# Patient Record
Sex: Female | Born: 1980 | Race: Black or African American | Hispanic: No | Marital: Single | Smoking: Current every day smoker
Health system: Southern US, Community
[De-identification: ages and names within clinical notes are randomized; demographics above are authoritative.]

## PROBLEM LIST (undated history)

## (undated) ENCOUNTER — Inpatient Hospital Stay: Payer: Self-pay

## (undated) DIAGNOSIS — R87629 Unspecified abnormal cytological findings in specimens from vagina: Secondary | ICD-10-CM

## (undated) DIAGNOSIS — I10 Essential (primary) hypertension: Secondary | ICD-10-CM

## (undated) DIAGNOSIS — Z789 Other specified health status: Secondary | ICD-10-CM

## (undated) HISTORY — PX: NO PAST SURGERIES: SHX2092

## (undated) HISTORY — DX: Unspecified abnormal cytological findings in specimens from vagina: R87.629

---

## 2005-11-04 ENCOUNTER — Emergency Department: Payer: Self-pay | Admitting: Emergency Medicine

## 2006-06-10 ENCOUNTER — Ambulatory Visit: Payer: Self-pay | Admitting: Family Medicine

## 2007-05-07 IMAGING — CR DG KNEE COMPLETE 4+V*R*
1 series · 4 of 4 positions shown · non-contrast
Comparison: none

REASON FOR EXAM: no known injury right knee swelling
COMMENTS:  LMP: N/A

PROCEDURE:     DXR - DXR KNEE RT COMP WITH OBLIQUES  - November 04, 2005  [DATE]
RESULT:     A suprapatellar effusion is appreciated, which appears to be
small to moderate.  The visualized bony skeleton demonstrates no evidence of
fracture or dislocation.

[Series 1: view not recorded · 0.17mm/px · 4 of 4 slices shown]
[im 1/4]
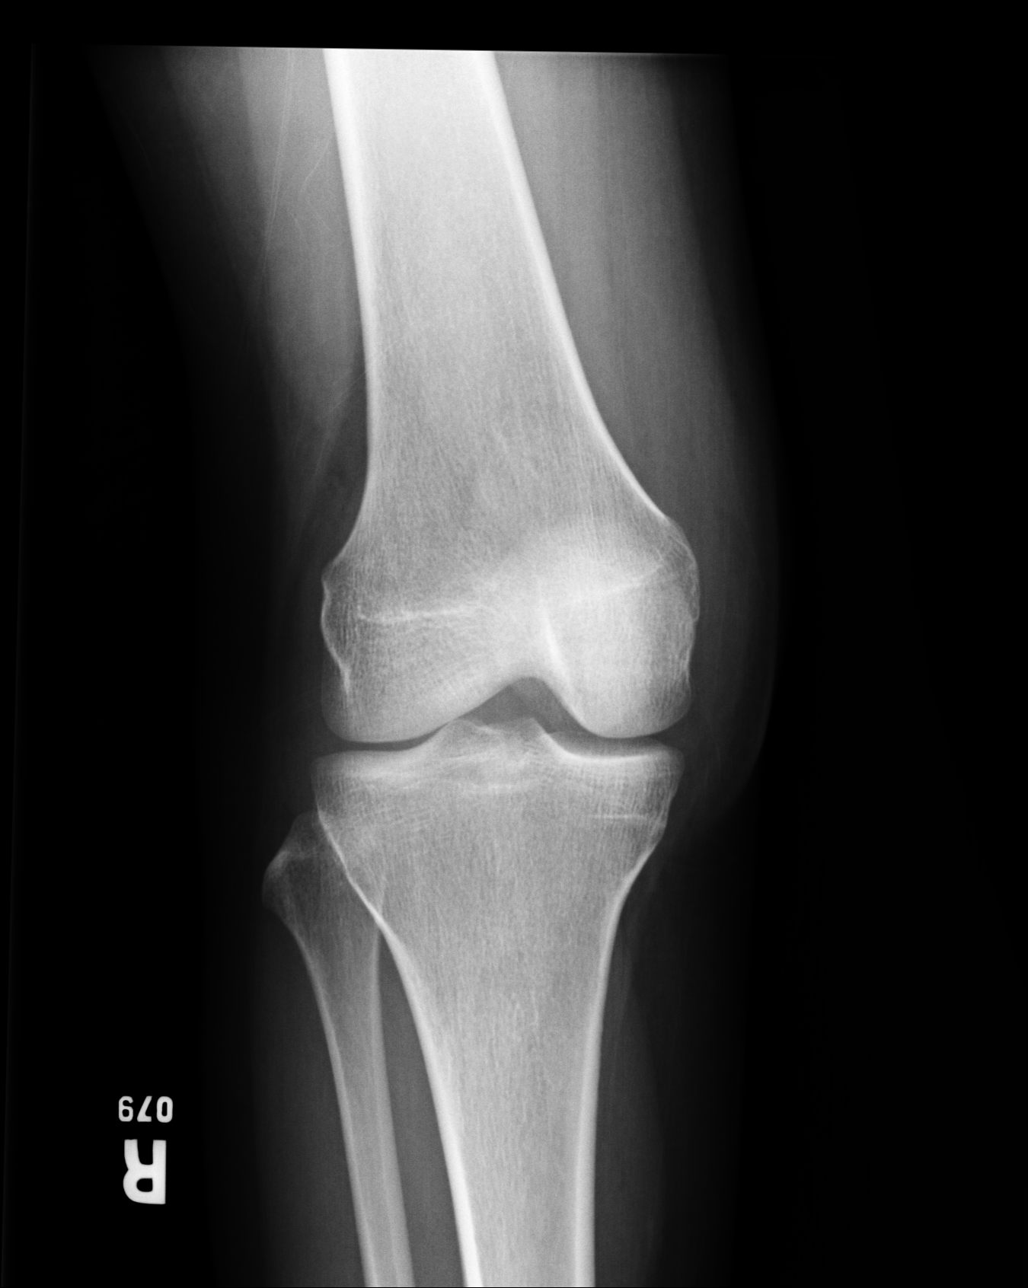
[im 2/4]
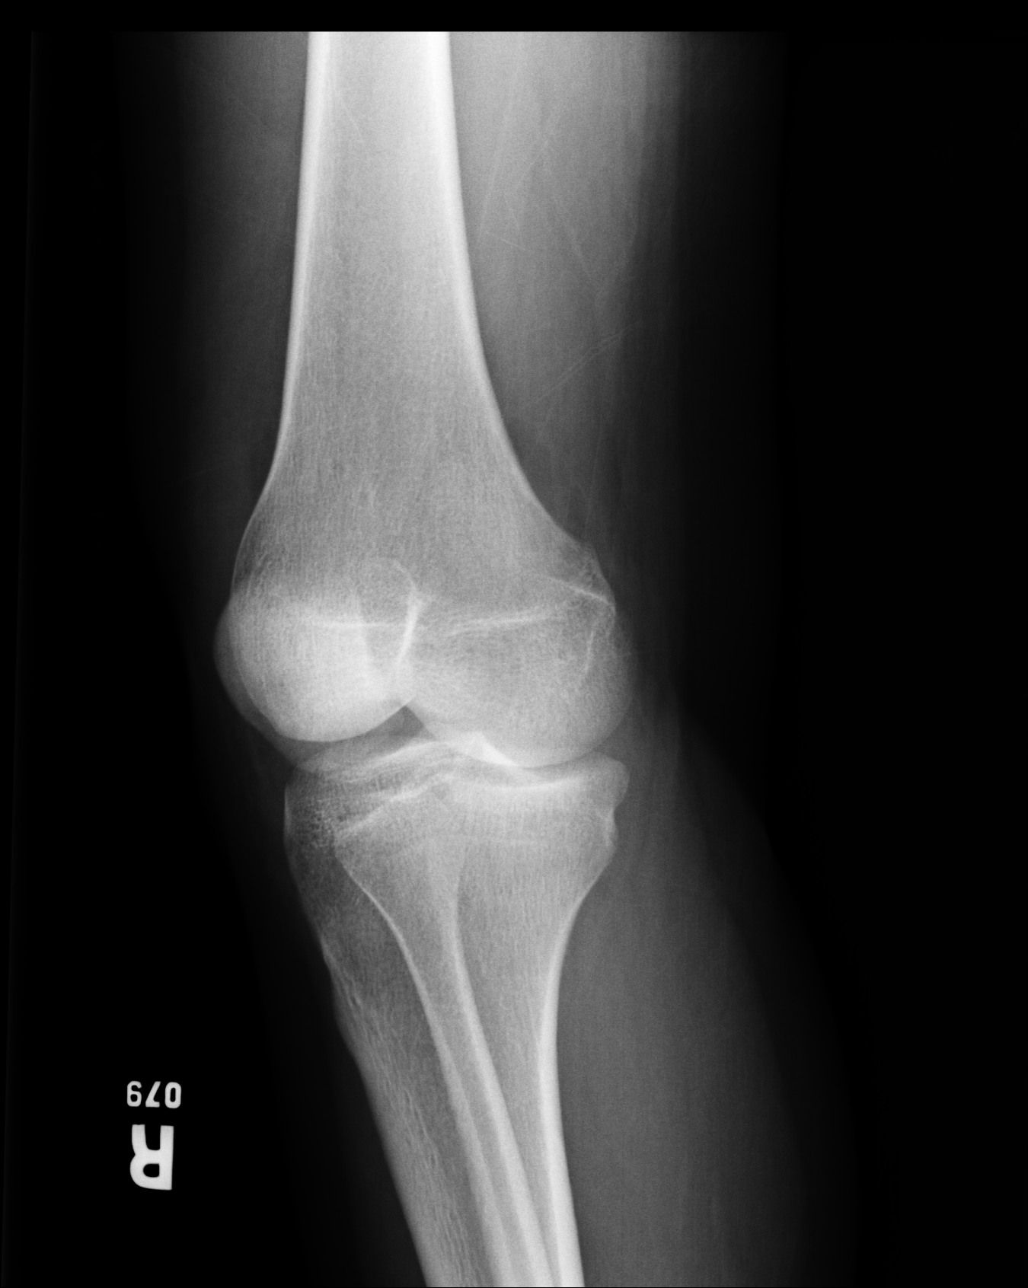
[im 3/4]
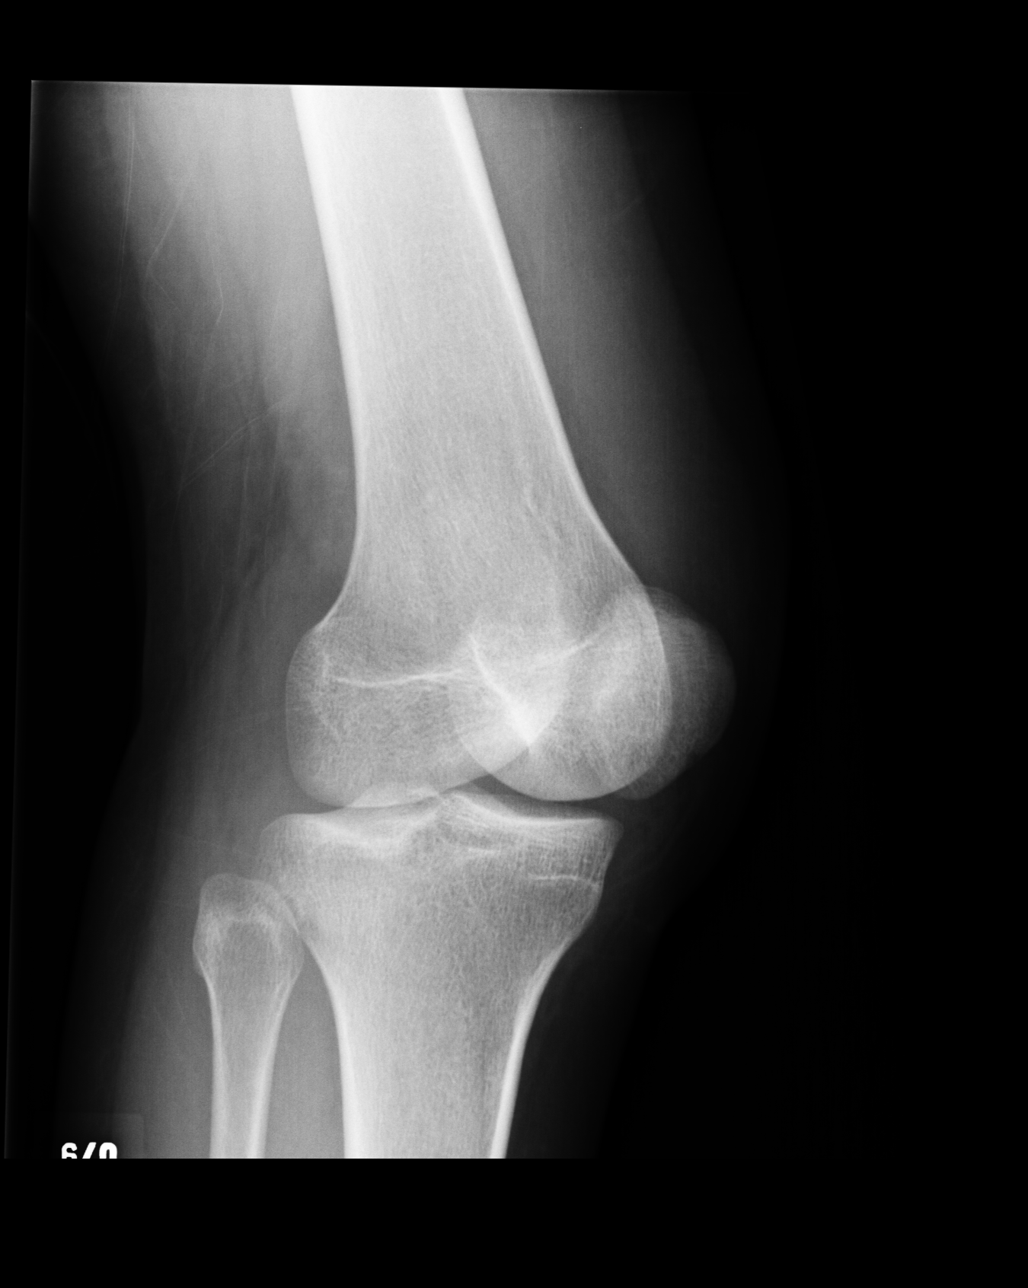
[im 4/4]
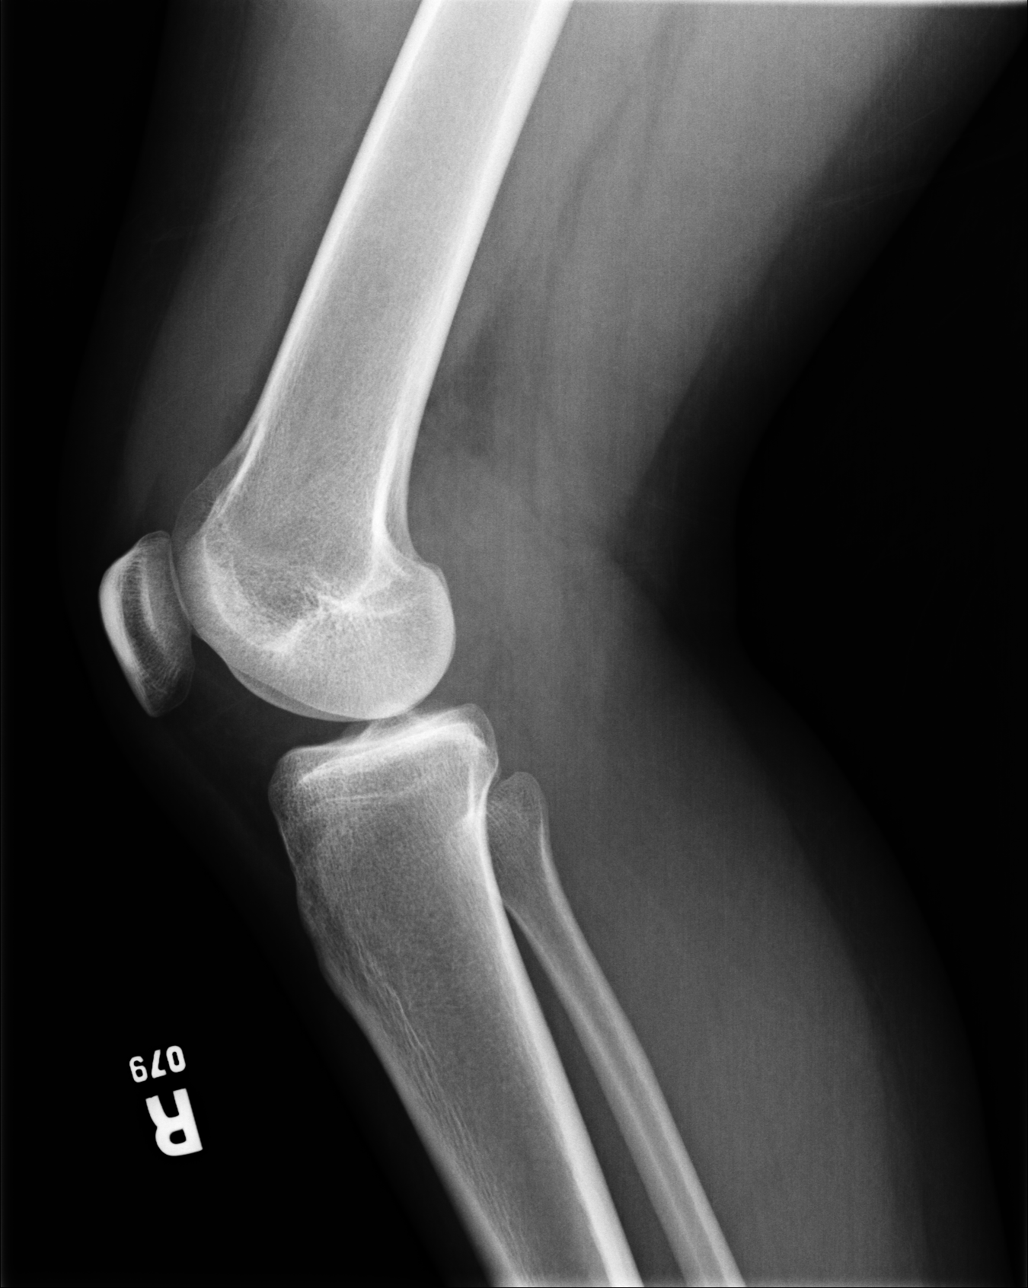

[4 of 4 positions shown; findings below may reference images not displayed]

IMPRESSION: 1)Suprapatellar effusion without evidence of fracture or dislocation.  If
there are persistent complaints of pain or persistent clinical concern
repeat evaluation in 7-10 days is recommended if clinically warranted.

## 2007-12-11 IMAGING — CT CT ABD-PELV W/ CM
1 of 2 series · 16 of 32 positions shown, 20 images · non-contrast
Comparison: none

REASON FOR EXAM: Severe abdominal pain, peritoneal signs
         Call report to: [phone]
COMMENTS:

PROCEDURE:     CT  - CT ABDOMEN / PELVIS  W  - June 10, 2006  [DATE]
RESULT:     The patient is experiencing severe abdominal pain, nausea and
vomiting.
TECHNIQUE: Axial images were obtained from the hemidiaphragms to the pubic
symphysis post intravenous and oral administration of contrast material.

[Series 2: appendicitis · axial · 0.61mm/px · z∈[-351,+3]mm · 16 of 130 slices shown, 20 images]
[im 6/130  soft-tissue]
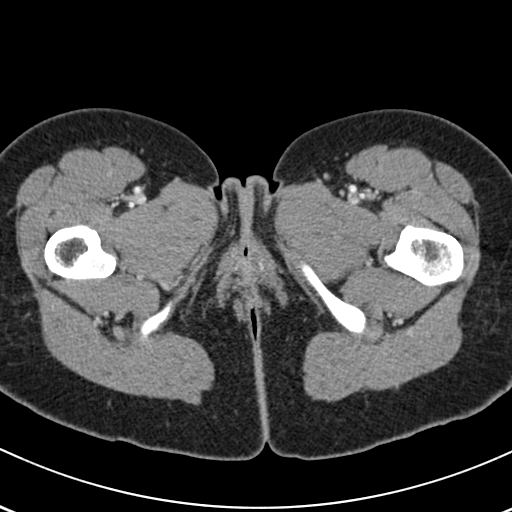
[im 6/130  bone]
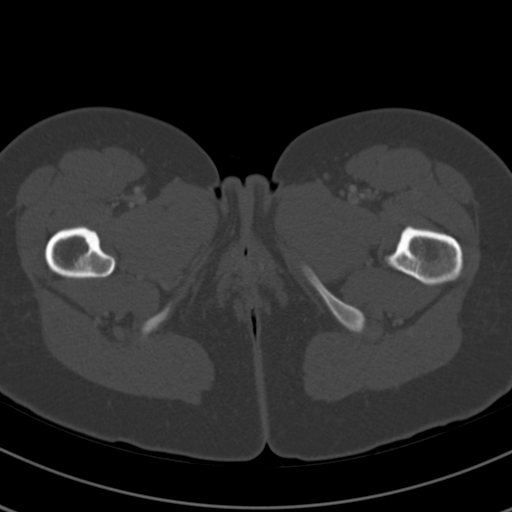
[im 16/130  soft-tissue]
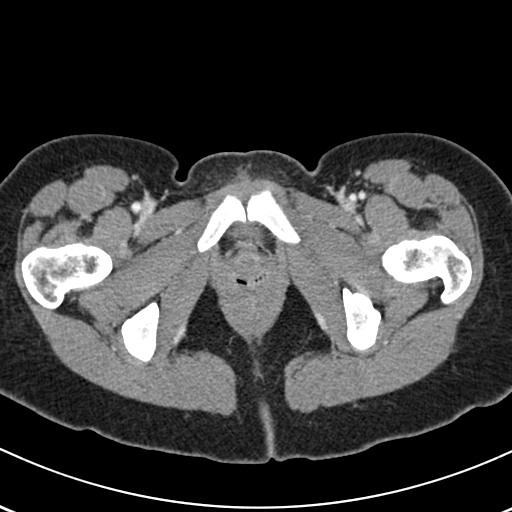
[im 26/130  soft-tissue]
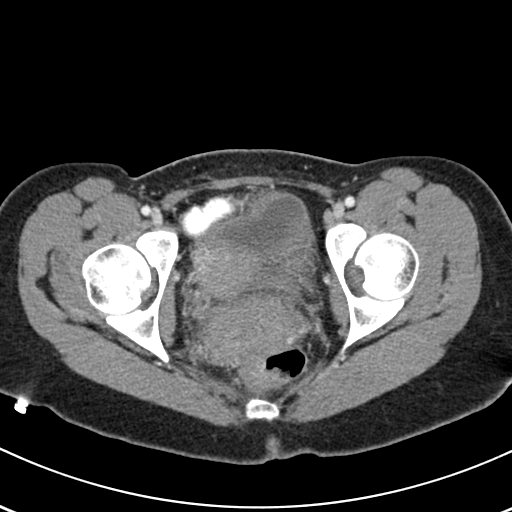
[im 37/130  soft-tissue]
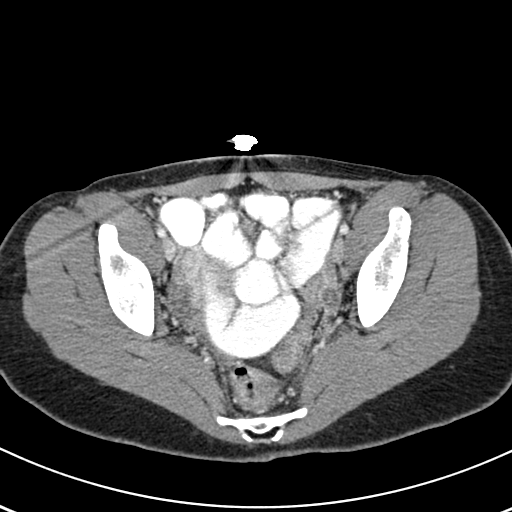
[im 42/130  soft-tissue]
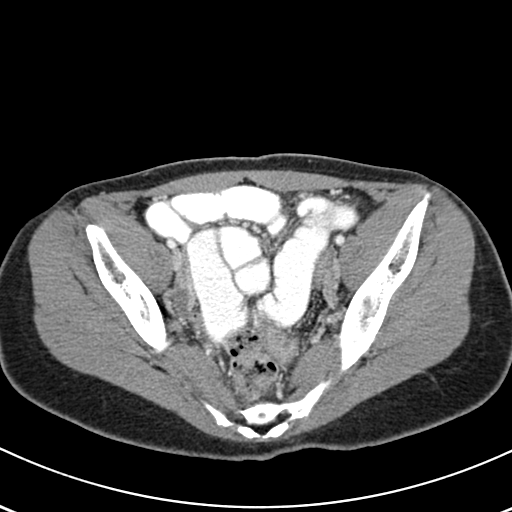
[im 52/130  soft-tissue]
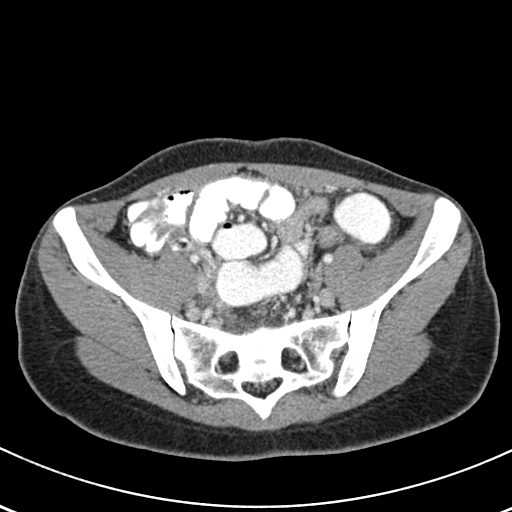
[im 62/130  soft-tissue]
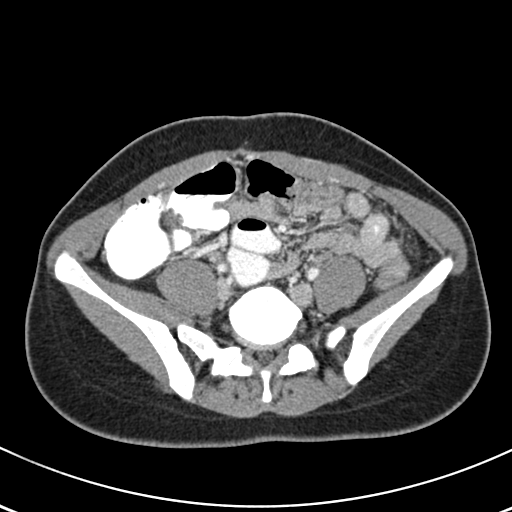
[im 68/130  soft-tissue]
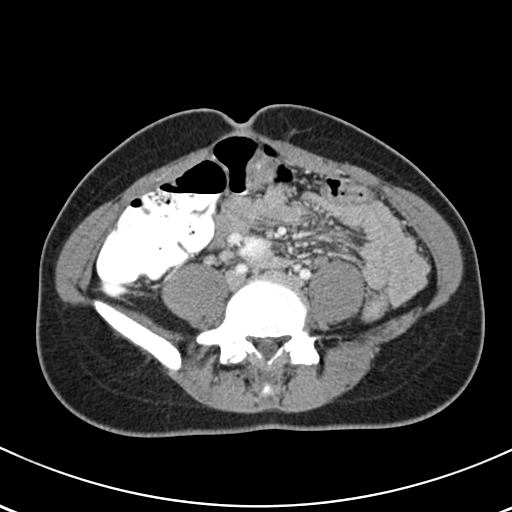
[im 78/130  soft-tissue]
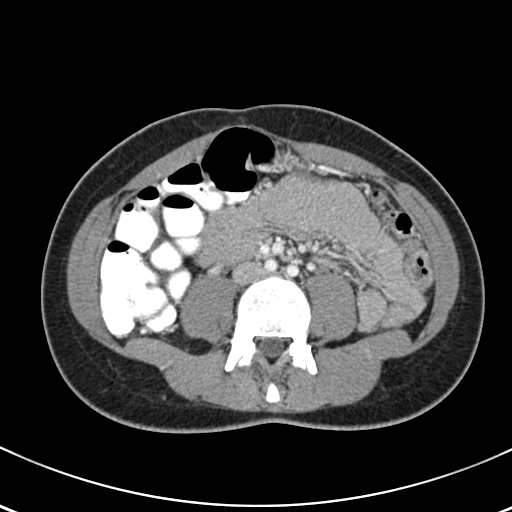
[im 78/130  bone]
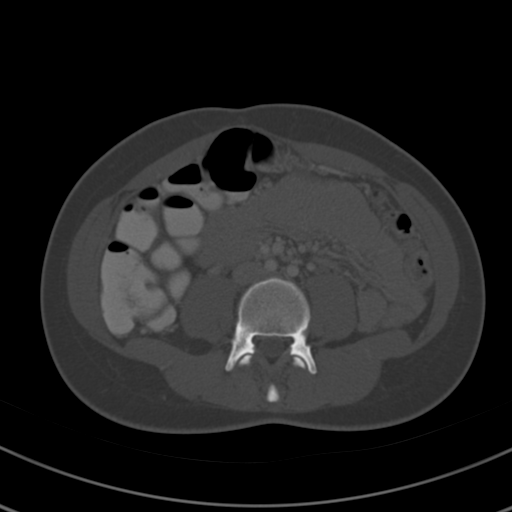
[im 88/130  soft-tissue]
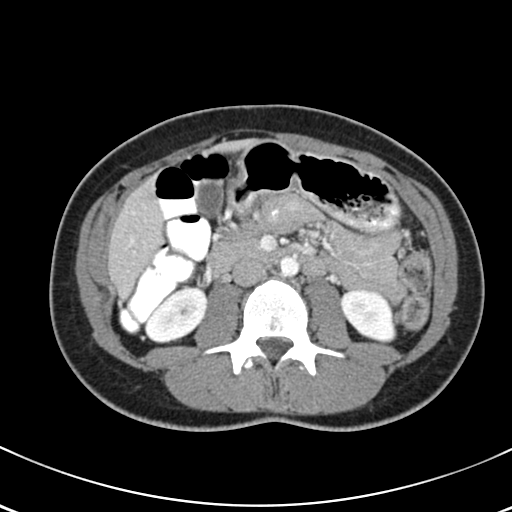
[im 99/130  soft-tissue]
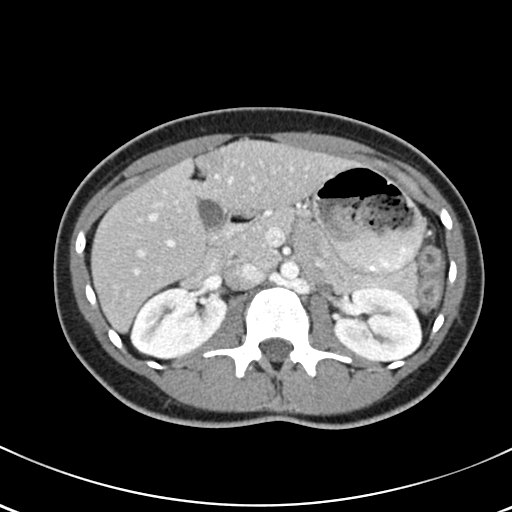
[im 104/130  soft-tissue]
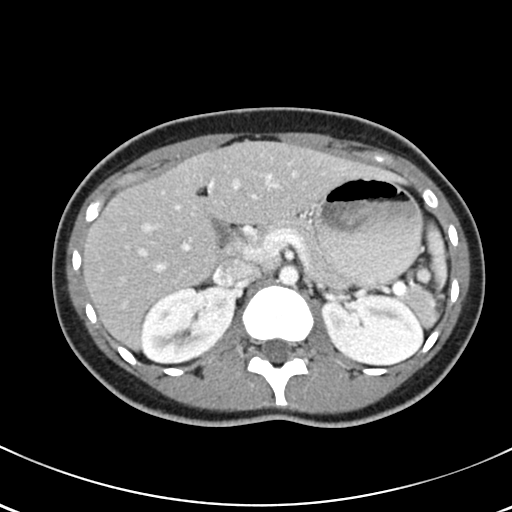
[im 109/130  lung]
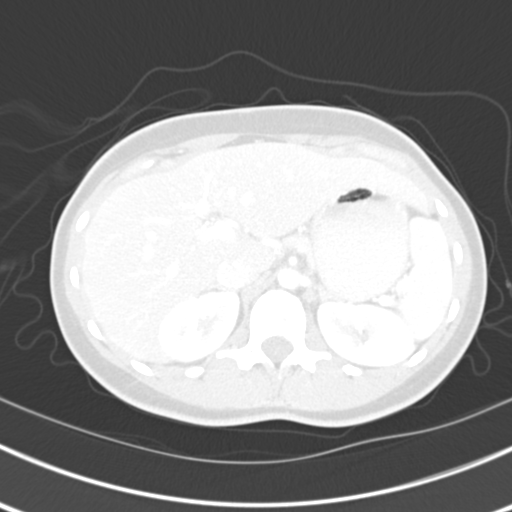
[im 114/130  soft-tissue]
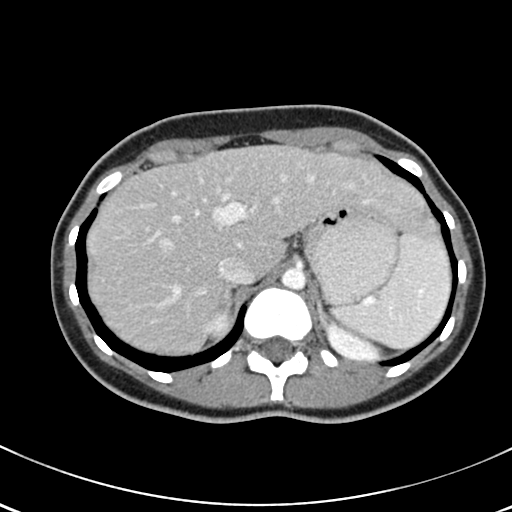
[im 114/130  lung]
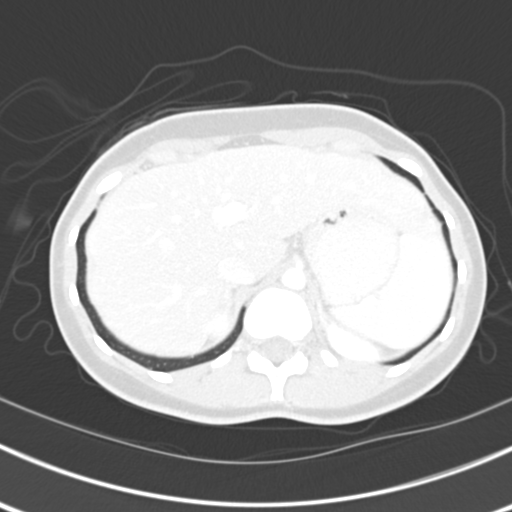
[im 119/130  lung]
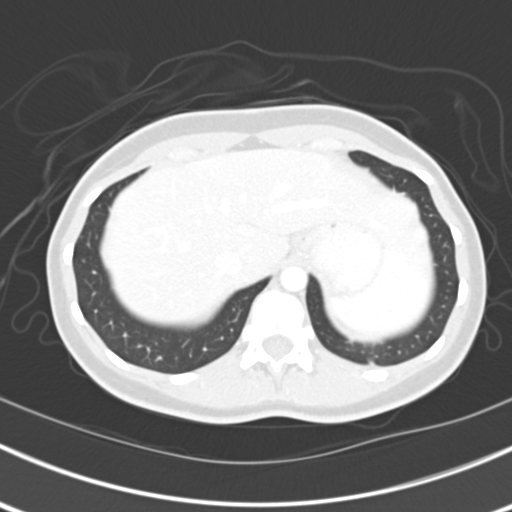
[im 124/130  soft-tissue]
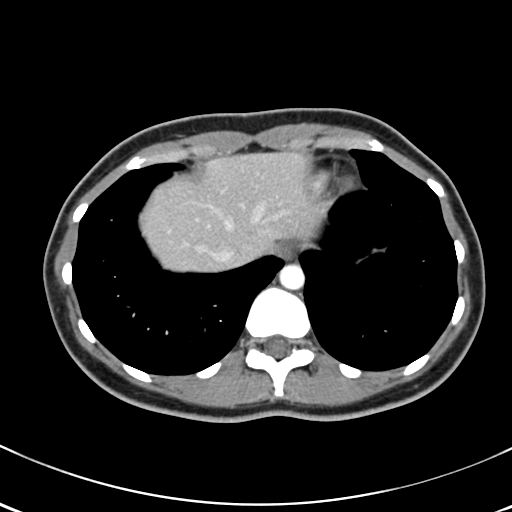
[im 124/130  lung]
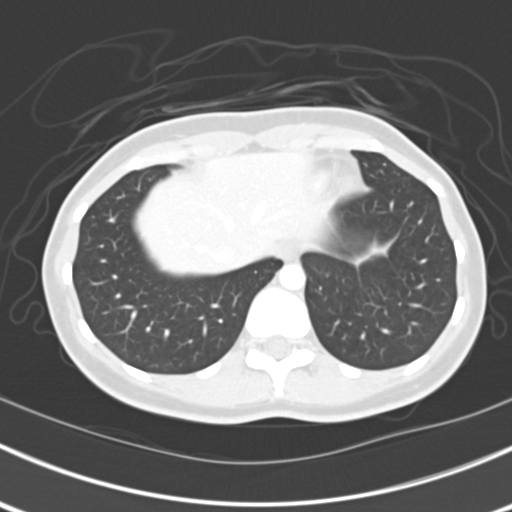

[16 of 32 positions shown; findings below may reference images not displayed]

FINDINGS: No major organ abnormalities are identified. Both kidneys excrete
the contrast with no hydronephrosis. No fluid is noted in the abdomen or
pelvis. No appendicitis is noted.

The images through the lung bases reveal the lung bases to be clear with no
effusions identified.
IMPRESSION: No significant abnormality is noted on the abdominal/pelvic
CT.

## 2009-11-03 ENCOUNTER — Emergency Department: Payer: Self-pay | Admitting: Emergency Medicine

## 2010-03-08 ENCOUNTER — Emergency Department: Payer: Self-pay | Admitting: Emergency Medicine

## 2013-11-15 ENCOUNTER — Inpatient Hospital Stay: Payer: Self-pay | Admitting: Internal Medicine

## 2013-11-15 LAB — CBC WITH DIFFERENTIAL/PLATELET
Basophil #: 0 10*3/uL (ref 0.0–0.1)
Basophil %: 0.3 %
Eosinophil #: 0 10*3/uL (ref 0.0–0.7)
Eosinophil %: 0.3 %
HCT: 42.8 % (ref 35.0–47.0)
HGB: 14.7 g/dL (ref 12.0–16.0)
LYMPHS PCT: 10.8 %
Lymphocyte #: 1 10*3/uL (ref 1.0–3.6)
MCH: 35.8 pg — ABNORMAL HIGH (ref 26.0–34.0)
MCHC: 34.2 g/dL (ref 32.0–36.0)
MCV: 105 fL — AB (ref 80–100)
MONO ABS: 0.7 x10 3/mm (ref 0.2–0.9)
Monocyte %: 8 %
NEUTROS PCT: 80.6 %
Neutrophil #: 7.3 10*3/uL — ABNORMAL HIGH (ref 1.4–6.5)
Platelet: 204 10*3/uL (ref 150–440)
RBC: 4.09 10*6/uL (ref 3.80–5.20)
RDW: 13.4 % (ref 11.5–14.5)
WBC: 9.1 10*3/uL (ref 3.6–11.0)

## 2013-11-15 LAB — COMPREHENSIVE METABOLIC PANEL
ALBUMIN: 3.9 g/dL (ref 3.4–5.0)
ANION GAP: 7 (ref 7–16)
AST: 38 U/L — AB (ref 15–37)
Alkaline Phosphatase: 100 U/L
BUN: 6 mg/dL — ABNORMAL LOW (ref 7–18)
Bilirubin,Total: 1 mg/dL (ref 0.2–1.0)
CALCIUM: 9.1 mg/dL (ref 8.5–10.1)
CO2: 23 mmol/L (ref 21–32)
Chloride: 106 mmol/L (ref 98–107)
Creatinine: 0.95 mg/dL (ref 0.60–1.30)
EGFR (African American): >60
GLUCOSE: 91 mg/dL (ref 65–99)
OSMOLALITY: 269 (ref 275–301)
Potassium: 3.3 mmol/L — ABNORMAL LOW (ref 3.5–5.1)
SGPT (ALT): 32 U/L (ref 12–78)
Sodium: 136 mmol/L (ref 136–145)
Total Protein: 7.7 g/dL (ref 6.4–8.2)

## 2013-11-15 LAB — URINALYSIS, COMPLETE
BILIRUBIN, UR: NEGATIVE
Glucose,UR: NEGATIVE mg/dL (ref 0–75)
NITRITE: NEGATIVE
PH: 6 (ref 4.5–8.0)
Protein: 30
RBC,UR: 5 /HPF (ref 0–5)
SPECIFIC GRAVITY: 1.008 (ref 1.003–1.030)
WBC UR: 334 /HPF (ref 0–5)

## 2013-11-16 LAB — CBC WITH DIFFERENTIAL/PLATELET
BASOS ABS: 0 10*3/uL (ref 0.0–0.1)
BASOS PCT: 0.4 %
Eosinophil #: 0.1 10*3/uL (ref 0.0–0.7)
Eosinophil %: 0.7 %
HCT: 34.7 % — ABNORMAL LOW (ref 35.0–47.0)
HGB: 12.1 g/dL (ref 12.0–16.0)
LYMPHS PCT: 18.5 %
Lymphocyte #: 1.4 10*3/uL (ref 1.0–3.6)
MCH: 36.5 pg — ABNORMAL HIGH (ref 26.0–34.0)
MCHC: 34.9 g/dL (ref 32.0–36.0)
MCV: 105 fL — AB (ref 80–100)
MONOS PCT: 6.7 %
Monocyte #: 0.5 x10 3/mm (ref 0.2–0.9)
Neutrophil #: 5.5 10*3/uL (ref 1.4–6.5)
Neutrophil %: 73.7 %
PLATELETS: 169 10*3/uL (ref 150–440)
RBC: 3.31 10*6/uL — AB (ref 3.80–5.20)
RDW: 13.3 % (ref 11.5–14.5)
WBC: 7.4 10*3/uL (ref 3.6–11.0)

## 2013-11-16 LAB — BASIC METABOLIC PANEL
Anion Gap: 9 (ref 7–16)
BUN: 3 mg/dL — AB (ref 7–18)
Calcium, Total: 7.4 mg/dL — ABNORMAL LOW (ref 8.5–10.1)
Chloride: 106 mmol/L (ref 98–107)
Co2: 22 mmol/L (ref 21–32)
Creatinine: 0.88 mg/dL (ref 0.60–1.30)
EGFR (African American): >60
Glucose: 152 mg/dL — ABNORMAL HIGH (ref 65–99)
Osmolality: 273 (ref 275–301)
POTASSIUM: 2.7 mmol/L — AB (ref 3.5–5.1)
Sodium: 137 mmol/L (ref 136–145)

## 2013-11-16 LAB — MAGNESIUM: Magnesium: 1.6 mg/dL — ABNORMAL LOW

## 2013-11-17 LAB — BASIC METABOLIC PANEL
ANION GAP: 8 (ref 7–16)
BUN: 2 mg/dL — ABNORMAL LOW (ref 7–18)
Calcium, Total: 8 mg/dL — ABNORMAL LOW (ref 8.5–10.1)
Chloride: 107 mmol/L (ref 98–107)
Co2: 24 mmol/L (ref 21–32)
Creatinine: 0.73 mg/dL (ref 0.60–1.30)
GLUCOSE: 88 mg/dL (ref 65–99)
Osmolality: 273 (ref 275–301)
Potassium: 3.2 mmol/L — ABNORMAL LOW (ref 3.5–5.1)
Sodium: 139 mmol/L (ref 136–145)

## 2013-11-17 LAB — URINE CULTURE

## 2014-07-10 ENCOUNTER — Emergency Department: Payer: Self-pay | Admitting: Emergency Medicine

## 2014-09-23 NOTE — Discharge Summary (Signed)
Dates of Admission and Diagnosis:  Date of Admission 15-Nov-2013   Date of Discharge 17-Nov-2013   Admitting Diagnosis pyelonephritis   Final Diagnosis pyelonephritis- E coli- sensitive to all Abx. Cecal wall thickening on CT Abd- suggested Colonoscopy as out pt by GI. Hypokalemia and hypomagensemia Smoking.    Chief Complaint/History of Present Illness a 34 year old female without significant past medical history who presents with complaints of abdominal pain, nausea, no vomiting.  Reports chills, loss of appetite at home, the patient had basic work-up done in ED which did show significant positive urinalysis, the patient had CT abdomen and pelvis which did show a questionable finding on the cecum area which was intussusception versus thickening of the cecum, the patient was seen by GI who did not think this is intussusception.  He was concerned about atypical appendicitis, the patient was seen by surgical service as well, who did not think her clinical picture does match appendicitis as her symptoms was mainly significant for significant right CVA tenderness with positive urinalysis where she was diagnosed with acute pyelonephritis where she was started on IV Rocephin in the ED, then switched to IV zosyn, the patient denies any chest pain, any shortness of breath, any diarrhea, any bright red blood per rectum, any constipation.   Allergies:  No Known Allergies:   Hepatic:  16-Jun-15 12:48   Bilirubin, Total 1.0  Alkaline Phosphatase 100 (45-117 NOTE: New Reference Range 04/22/13)  SGPT (ALT) 32  SGOT (AST)  38  Total Protein, Serum 7.7  Albumin, Serum 3.9  Routine Micro:  16-Jun-15 12:48   Organism Name ESCHERICHIA COLI  Organism Quantity >100,000 CFU/ML  Nitrofurantoin Sensitivity S  Cefazolin Sensitivity S  Ampicillin Sensitivity S  Ceftriaxone Sensitivity S  Ciprofloxacin Sensitivity S  Gentamicin Sensitivity S  Imipenem Sensitivity S  Levofloxacin Sensitivity S   Trimethoprim/Sulfamethoxazole Sensitivty S  Cefoxitin Sensitivity S  Micro Text Report URINE CULTURE   ORGANISM 1                >100,000 CFU/ML ESCHERICHIA COLI   ANTIBIOTIC                    ORG#1     AMPICILLIN                    S         CEFAZOLIN                     S         CEFOXITIN                     S         CEFTRIAXONE                   S         CIPROFLOXACIN                 S         GENTAMICIN                    S         IMIPENEM                      S         LEVOFLOXACIN                  S  NITROFURANTOIN                S         TRIMETHOPRIM/SULFAMETHOXAZOLE S  Specimen Source CLEAN CATCH  Organism 1 >100,000 CFU/ML ESCHERICHIA COLI  Result(s) reported on 17 Nov 2013 at 10:51AM.  Culture Comment ID TO FOLLOW SENSITIVITIES TO FOLLOW  Result(s) reported on 16 Nov 2013 at 10:04AM.  Routine Chem:  16-Jun-15 12:48   Glucose, Serum 91  BUN  6  Creatinine (comp) 0.95  Sodium, Serum 136  Potassium, Serum  3.3  Chloride, Serum 106  CO2, Serum 23  Calcium (Total), Serum 9.1  Anion Gap 7  Osmolality (calc) 269  eGFR (African American) >60  eGFR (Non-African American) >60 (eGFR values <66mL/min/1.73 m2 may be an indication of chronic kidney disease (CKD). Calculated eGFR is useful in patients with stable renal function. The eGFR calculation will not be reliable in acutely ill patients when serum creatinine is changing rapidly. It is not useful in  patients on dialysis. The eGFR calculation may not be applicable to patients at the low and high extremes of body sizes, pregnant women, and vegetarians.)  17-Jun-15 05:52   Magnesium, Serum  1.6 (1.8-2.4 THERAPEUTIC RANGE: 4-7 mg/dL TOXIC: > 10 mg/dL  -----------------------)  18-Jun-15 05:32   Glucose, Serum 88  BUN  2  Creatinine (comp) 0.73  Sodium, Serum 139  Potassium, Serum  3.2  Chloride, Serum 107  CO2, Serum 24  Calcium (Total), Serum  8.0  Anion Gap 8  Osmolality (calc) 273  eGFR  (African American) >60  eGFR (Non-African American) >60 (eGFR values <54mL/min/1.73 m2 may be an indication of chronic kidney disease (CKD). Calculated eGFR is useful in patients with stable renal function. The eGFR calculation will not be reliable in acutely ill patients when serum creatinine is changing rapidly. It is not useful in  patients on dialysis. The eGFR calculation may not be applicable to patients at the low and high extremes of body sizes, pregnant women, and vegetarians.)  Routine UA:  16-Jun-15 12:48   Color (UA) Yellow  Clarity (UA) Hazy  Glucose (UA) Negative  Bilirubin (UA) Negative  Ketones (UA) 1+  Specific Gravity (UA) 1.008  Blood (UA) 2+  pH (UA) 6.0  Protein (UA) 30 mg/dL  Nitrite (UA) Negative  Leukocyte Esterase (UA) 3+ (Result(s) reported on 15 Nov 2013 at 01:33PM.)  RBC (UA) 5 /HPF  WBC (UA) 334 /HPF  Bacteria (UA) 1+  Epithelial Cells (UA) 1 /HPF  Mucous (UA) PRESENT (Result(s) reported on 15 Nov 2013 at 01:33PM.)  Routine Hem:  16-Jun-15 12:48   WBC (CBC) 9.1  RBC (CBC) 4.09  Hemoglobin (CBC) 14.7  Hematocrit (CBC) 42.8  Platelet Count (CBC) 204  MCV  105  MCH  35.8  MCHC 34.2  RDW 13.4  Neutrophil % 80.6  Lymphocyte % 10.8  Monocyte % 8.0  Eosinophil % 0.3  Basophil % 0.3  Neutrophil #  7.3  Lymphocyte # 1.0  Monocyte # 0.7  Eosinophil # 0.0  Basophil # 0.0 (Result(s) reported on 15 Nov 2013 at 01:06PM.)   PERTINENT RADIOLOGY STUDIES: LabUnknown:    16-Jun-15 16:02, CT Abdomen and Pelvis With Contrast  PACS Image   CT:  CT Abdomen and Pelvis With Contrast   REASON FOR EXAM:    (1) RLQ pain; (2) RLQ pain  COMMENTS:       PROCEDURE: CT  - CT ABDOMEN / PELVIS  W  - Nov 15 2013  4:02PM  CLINICAL DATA:  Right lower quadrant pain    EXAM:  CT ABDOMEN AND PELVIS WITH CONTRAST    TECHNIQUE:  Multidetector CTimaging of the abdomen and pelvis was performed  using the standard protocol following bolus administration  of  intravenous contrast.  CONTRAST:  85 mL Isovue 370    COMPARISON:  11/03/2009    FINDINGS:  The lung bases are free of acute infiltrate orsizable effusion.    The liver, gallbladder, spleen, adrenal glands and pancreas are  within normal limits. Kidneys are well visualized bilaterally and  demonstrate a normal enhancement pattern. No calculi or obstructive  changes are seen. The bladder is well distended with non-opacified  urine. The uterus is well visualized and within normal limits. No  pelvic mass lesion is seen.    The appendix is well visualized and within normal limits. In the  region of the cecum however there is an irregularly enhancing  abnormality identified. It has is somewhat of a target appearance  with varying layers of enhancement surrounded by fat. This may  represent a focal intussusception within the cecum or a collapsed  cecum with approximation of the enhancing mucosal walls. Direct  visualization is recommended. These adjacent terminal ileum appears  within normal limits.     IMPRESSION:  Changes in the right lower quadrant involving the cecum as  described. This is of uncertain etiology but may be related to a  focal intussusception within the cecum which would raise suspicion  for an underlying mass lesion. Alternatively this may be related to  a decompressed cecum with approximation of the enhancing mucosal  walls. Direct visualization is recommended for further evaluation.  Electronically Signed    By: Inez Catalina M.D.    On: 11/15/2013 16:21         Verified By: Everlene Farrier, M.D.,   Pertinent Past History:  Pertinent Past History none   Hospital Course:  Hospital Course * Ac pyelonephritis- On IV zosyn.   checked sensitivity report of Ur cx.- swich to oral cipro   Pt feels better.  * findings on ceacum on CT abd   Dr. Tiffany Kocher suggest to follow in clinic to schecule Colonoscopy.  * Hypokalemia   replaced oral- recheck.  *  hypomagnesemia    replace IV  * Smoking    Cessation councelling done for 4 min- offered Nicotin patch- she does not want it.   Condition on Discharge Stable   Code Status:  Code Status Full Code   DISCHARGE INSTRUCTIONS HOME MEDS:  Medication Reconciliation: Patient's Home Medications at Discharge:     Medication Instructions  acetaminophen-oxycodone 325 mg-5 mg oral tablet  1 tab(s) orally 1 to 4 times a day, As Needed, pain , As needed, pain   cipro 250 mg oral tablet  1 tab(s) orally every 12 hours x 4 days     Physician's Instructions:  Diet Regular   Activity Limitations As tolerated   Return to Work Not Applicable   Time frame for Follow Up Appointment 1-2 weeks  Gi clinic   Other Comments Follow with Dr. Tiffany Kocher in 1-2 weeks.     Gaylyn Cheers T(Ordered): [facility], [address], [place], [place] [address], [place] [phone]  Electronic Signatures: Vaughan Basta (MD)  (Signed 22-Jun-15 08:06)  Authored: ADMISSION DATE AND DIAGNOSIS, CHIEF COMPLAINT/HPI, Allergies, PERTINENT LABS, PERTINENT RADIOLOGY STUDIES, PERTINENT PAST HISTORY, HOSPITAL COURSE, DISCHARGE INSTRUCTIONS HOME MEDS, PATIENT INSTRUCTIONS, Follow Up Physician   Last Updated: 22-Jun-15 08:06 by Vaughan Basta (  MD) 

## 2014-09-23 NOTE — Consult Note (Signed)
PATIENT NAME:  Mariah Fox, Mariah Fox MR#:  [id number] DATE OF BIRTH:  [birth year]  DATE OF CONSULTATION:  11/15/2013  CONSULTING PHYSICIAN:  Manya Silvas, MD  The patient is a 34 year old black female who on Sunday had the onset of pain in the abdomen when she woke up, described as periumbilical pain, and then the pain seemed to radiate into her back. Now the pain is on the right and into her back. Yesterday the pain was about a 6, and today it is a 10. She had a bowel movement yesterday. This did not produce any change in the abdominal pain. She denies vomiting. She denies nausea. She denies fever. She has not felt like eating. I was asked to see her in consultation.   She had a CAT scan with a very unusual reading, which I have never seen before. The interpretation of the CAT scan was in the region of the cecum, there is irregular enhancing abnormality identified with varying layers of enhancement surrounded by fat, possible focal intussusception within the cecum or collapsed cecum with approximation of the enhancing mucosal walls. The adjacent terminal ileum appeared to be normal.   PAST MEDICAL HISTORY: The patient has had recurrent bladder infections, but not this year.   She is on Depo shots and does not have periods.   HABITS: Three or 4 cigarettes a day. Three beers a day.   CURRENT MEDICATIONS: None.   ALLERGIES: None.   PHYSICAL EXAMINATION: GENERAL: Black female, looks to be in some discomfort.  VITAL SIGNS: Blood pressure 169/107, respirations 23, pulse 76, temperature 98.5 (5 hours ago).  HEENT: Sclerae anicteric. Conjunctivae negative. Tongue negative.  CHEST: Clear.  HEART: Regular rate and rhythm.  ABDOMEN: Bowel sounds are active. The abdomen is nondistended. There is no abdominal tenderness on the left. There is only slight abdominal tenderness on the right. There are no palpable masses, no palpable loops of bowel, no significant guarding. There is significant right-sided CVA  tenderness to light percussion.   LABORATORY DATA: White count 9.1, hemoglobin 14.7, hematocrit 42.8, platelet count 204. She has active urine. She has 334 white cells per high-power field, 3+ leukocyte esterase, 2+ blood, and 1+ ketones. Liver panel is normal, except for SGOT of 38. Met-B is normal, except for BUN of 6 and potassium of 3.3. A urine pregnancy test is normal.   ASSESSMENT AND PLAN: Given the onset of pain in the periumbilical area and now felt in the right side and into the back, I am concerned about the possibility of an appendicitis presenting in an unusual manner. Although the appendix was thought to be seen on CAT scan, there was a vascular structure nearby, which possibly could have been confused with the appendix. She could have an inflamed appendix lying against a ureter giving this confusing picture. I have never seen a cecal intussusception, and given the absence of significant pain in the right lower quadrant on exam, I doubt this entity is the source for her problems. I am concerned that this is an unusual presentation of an appendicitis. It started out with periumbilical pain 3 days ago, and I recommend surgical consultation tonight. I also agree with coverage with antibiotics for possible urinary tract infection. I see no cause for endoscopy at this time. I will follow with you. May consider repeating a CAT scan of the abdomen tomorrow if. She does not have any kind of surgical procedure tonight.   ____________________________ Manya Silvas, MD rte:jcm D: 11/15/2013 18:41:14 ET T:  11/15/2013 19:00:13 ET JOB#: [id number]  cc: Manya Silvas, MD, <Dictator> Sheryl L. Benjaman Lobe, MD Glena Norfolk. Rexene Edison, MD Micheline Maze, MD Manya Silvas MD ELECTRONICALLY SIGNED 11/18/2013 13:48

## 2014-09-23 NOTE — H&P (Signed)
PATIENT NAME:  Mariah Fox, Mariah Fox MR#:  [id number] DATE OF BIRTH:  02/15/1981  DATE OF ADMISSION:  11/15/2013  REFERRING PHYSICIAN:  Dr. Benjaman Lobe.  PRIMARY CARE PHYSICIAN:  [facility].   HISTORY OF PRESENT ILLNESS:  This is a 34 year old female without significant past medical history who presents with complaints of abdominal pain, nausea, no vomiting.  Reports chills, loss of appetite at home, the patient had basic work-up done in ED which did show significant positive urinalysis, the patient had CT abdomen and pelvis which did show a questionable finding on the cecum area which was intussusception versus thickening of the cecum, the patient was seen by GI who did not think this is intussusception.  He was concerned about atypical appendicitis, the patient was seen by surgical service as well, who did not think her clinical picture does match appendicitis as her symptoms was mainly significant for significant right CVA tenderness with positive urinalysis where she was diagnosed with acute pyelonephritis where she was started on IV Rocephin in the ED, then switched to IV zosyn, the patient denies any chest pain, any shortness of breath, any diarrhea, any bright red blood per rectum, any constipation.   PAST MEDICAL HISTORY:  None.   PAST SURGICAL HISTORY:  None.   SOCIAL HISTORY:  The patient smokes 3 to 4 cigarettes per day.  Drinks alcohol occasionally over the weekends.  No illicit drug use.   FAMILY HISTORY:  Denies any family history of coronary artery disease at a young age.  Denies any family history of kidney stones.   ALLERGIES:  No known drug allergies.   HOME MEDICATIONS:  None.   REVIEW OF SYSTEMS:   CONSTITUTIONAL:  The patient denies fever, but reports chills, fatigue, weakness.  EYES:  Denies blurry vision, double vision, inflammation. EARS, NOSE, THROAT:  Denies tinnitus, ear pain, hearing loss, epistaxis.  RESPIRATORY:  Denies cough, wheezing, hemoptysis, shortness of  breath.  CARDIOVASCULAR:  Denies chest pain, orthopnea, edema, palpitations.  GASTROINTESTINAL:  Denies diarrhea, constipation, bright red blood per rectum.  she reports nausea, abdominal pain.  GENITOURINARY:  Denies dysuria, hematuria, renal colic. ENDOCRINE:  Denies polyuria, polydipsia, heat or cold intolerance.  HEMATOLOGY:  Denies anemia, easy bruising or bleeding diathesis.  INTEGUMENT:  Denies acne, rash or skin lesion.  MUSCULOSKELETAL:  Denies any swelling, gout, cramps.  NEUROLOGIC:  Denies CVA, TIA, tremors, vertigo.  PSYCHIATRIC:  Denies anxiety, insomnia, or depression.   PHYSICAL EXAMINATION: VITAL SIGNS:  Temperature 98.3, pulse 82, respiratory rate 20, blood pressure 135/87, saturating 98% on room air.  GENERAL:  Well-nourished female, looks comfortable in bed, in no apparent distress.  HEENT:  Head atraumatic, normocephalic.  Pupils equal, reactive to light.  Pink conjunctivae.  Anicteric sclerae.  Dry oral mucosa.  NECK:  Supple.  No thyromegaly.  No JVD.  CHEST:  Good air entry bilaterally.  No wheezing, rales, or rhonchi.  CARDIOVASCULAR:  S1, S2 heard.  No rubs, murmurs or gallops.  ABDOMEN:  Soft, nontender, nondistended.  Bowel sounds present.  Has significant right CVA tenderness to palpation.  EXTREMITIES:  No edema.  No clubbing.  No cyanosis.  PSYCHIATRIC:  Appropriate affect.  Awake, alert x 3.  Intact judgment and insight.  NEUROLOGIC:  Cranial nerves grossly intact.  Motor five out of five.  No focal deficits.  MUSCULOSKELETAL:  No joint effusion or erythema.  SKIN:  Warm and dry.  Delayed skin turgor.   PERTINENT LABORATORY DATA:  Glucose 91, BUN 6, creatinine 0.95,  sodium 136, potassium 3.3, chloride 106, CO2 23, ALT 32, AST 38, alk phos 100.  White blood cells 9.1, hemoglobin 14.7, hematocrit 42.8, platelets 204.  Urinalysis showing 334 white blood cells, +3 leukocyte esterase.   IMAGING STUDIES:  CT abdomen and pelvis with contrast showing changes in the  right lower quadrant involving the cecum, uncertain etiology, possible focal intussusception within the cecum.   ASSESSMENT AND PLAN: 1.  Acute pyelonephritis, the patient had concerning CT finding for possible intussusception, which is deemed to be unlikely diagnosis by gastroenterology, as well appendicitis seems to be deemed to be unlikely by surgical service, appreciate both of their consultations while the patient was still in the Emergency Department, the patient's sepsis most likely related to her acute pyelonephritis as she is having significant right costovertebral angle tenderness.  She will be continued on intravenous Zosyn.  We will continue clinical evaluation for the patient and if she is still having abdominal pain after appropriate treatment with intravenous antibiotic, would repeat her imaging with a CT scan.  Meanwhile, we will continue the patient with regular diet. 2.  Hypokalemia.  We will replace.  3.  Deep vein thrombosis prophylaxis.  SubQ heparin.  4.  CODE STATUS:  FULL CODE.   Total time spent on admission and patient care 45 minutes.      ____________________________ Albertine Patricia, MD dse:ea D: 11/15/2013 23:50:59 ET T: 11/16/2013 00:35:32 ET JOB#: [id number]  cc: Albertine Patricia, MD, <Dictator> DAWOOD Graciela Husbands MD ELECTRONICALLY SIGNED 11/16/2013 1:50

## 2014-09-23 NOTE — H&P (Signed)
   Subjective/Chief Complaint 2 days periumbilical pain now more severe right back pain, unusual ? intussusception/thickening of cecum   History of Present Illness Mariah Fox is a pleasant 34 yo with no significant PMH who presents with 2 days of periumbilical pain and progressive right back pain.  Says her pain began suddenly and has gotten progressively worse.  Worse with movement.  No obvious fevers/chills.  Has never had this pain before.  No nausea/vomiting but has not wanted to eat due to pain.  No unusual ingestions.  UA+ ULE with many white cells   Past History None   Past Med/Surgical Hx:  denies:   ALLERGIES:  NKA: None  Family and Social History:  Family History Hypertension  Cancer  GM cancer, Mom HTN   Social History positive  tobacco, positive ETOH, 3-4 cig/day, social EtOH   Place of Living Home   Review of Systems:  Subjective/Chief Complaint Right back pain, ? periumbilical pain   Fever/Chills No   Cough No   Sputum No   Abdominal Pain Yes   Diarrhea No   Constipation No   Nausea/Vomiting No   SOB/DOE No   Dysuria No   Tolerating Diet Yes   Physical Exam:  GEN well developed, well nourished, no acute distress   HEENT pink conjunctivae, PERRL, hearing intact to voice, good dentition   RESP normal resp effort  clear BS  no use of accessory muscles   CARD regular rate  no murmur  no thrills   ABD positive Flank Tenderness  no hernia  soft  normal BS  CVA tenderness, abd palpation results in back pain   EXTR negative cyanosis/clubbing, negative edema   SKIN normal to palpation, No rashes, No ulcers   NEURO cranial nerves intact, negative rigidity, negative tremor, follows commands, strength:, motor/sensory function intact   PSYCH alert, A+O to time, place, person, good insight    Assessment/Admission Diagnosis Mariah Fox is a pleasant 34 yo F who presents with ? periumbilical pain, worsening right CVA tenderness.  + UA.  Min abd pain and any  pain from palpation of abdomen refers to back.  ? intussusception on CT vs collapse of cecum.  Does not clinically match with appendicitis.  Clinical pyelonephritis   Plan Admission for IV abx for presumed pyelonephritis.  Okay for liquids.  If still with significant pain after abx will consider repeat CT scan.  Should obtain f/u CT scan anyway to see if colon findings resolve spontaneously or whether outpatient vs inpatient colonoscopy is warranted.   Electronic Signatures: Jarvis NewcomerLundquist, Christopher A (MD)  (Signed 16-Jun-15 22:27)  Authored: CHIEF COMPLAINT and HISTORY, PAST MEDICAL/SURGIAL HISTORY, ALLERGIES, FAMILY AND SOCIAL HISTORY, REVIEW OF SYSTEMS, PHYSICAL EXAM, ASSESSMENT AND PLAN   Last Updated: 16-Jun-15 22:27 by Jarvis NewcomerLundquist, Christopher A (MD)

## 2014-09-23 NOTE — Consult Note (Signed)
See dictated note.  She had onset of periumbilical pain 3 days ago which migrated to right mid lower abd, nausea, some anorexia, Pain initially was a "6" and now is a "10".  PE with minimal tenderness in RLQ but much more tender in R CVA area of back.  She has a positive urine.  CT read as abnormal cecum.  Wonder about retroverted appendix, appendix lying against a ureter,  consider cecal tumor.  I have never seen intussception of the cecum.  Agree with antibiotics and recommend surgical consult tonight.  Electronic Signatures: Scot JunElliott, Robert T (MD)  (Signed on 16-Jun-15 18:51)  Authored  Last Updated: 16-Jun-15 18:51 by Scot JunElliott, Robert T (MD)

## 2014-09-23 NOTE — Consult Note (Signed)
Pt feels much better today, slight discomfort in right upper abd, no pain with deep coughing.  No signif discomfort with deep palpation of abdomen, no nausea, vomiting, chills, fever.  T max 99.2.  She likely has early pyelo with somewhat atypical presentation but is definitely much better than last night.  Due to abnormal cecal area on CT she needs an eventual colonoscopy.  I offered to do it tomorrow but her daughter is going to court tomorrow and she would like to get out tomorrow and do the follow up as out patient,  I can see her in office in 2 weeks and then schedule a colonoscopy as out patient.  Culture shows GNR  over 100,000 colonies.  Please schedule follow up visit for me in 2-3 weeks after discharge.  Electronic Signatures: Scot JunElliott, Robert T (MD)  (Signed on 17-Jun-15 14:01)  Authored  Last Updated: 17-Jun-15 14:01 by Scot JunElliott, Robert T (MD)

## 2014-09-24 ENCOUNTER — Emergency Department
Admit: 2014-09-24 | Disposition: A | Discharge status: Home / Self Care | Payer: Self-pay | Admitting: Emergency Medicine

## 2014-09-24 LAB — COMPREHENSIVE METABOLIC PANEL
ALK PHOS: 91 U/L
Albumin: 4 g/dL
Anion Gap: 10 (ref 7–16)
BUN: <5 mg/dL
Bilirubin,Total: 0.9 mg/dL
CALCIUM: 9.1 mg/dL
CHLORIDE: 97 mmol/L — AB
Co2: 27 mmol/L
Creatinine: 0.84 mg/dL
EGFR (Non-African Amer.): >60
Glucose: 124 mg/dL — ABNORMAL HIGH
Potassium: 3.3 mmol/L — ABNORMAL LOW
SGOT(AST): 37 U/L
SGPT (ALT): 24 U/L
SODIUM: 134 mmol/L — AB
Total Protein: 7.9 g/dL

## 2014-09-24 LAB — URINALYSIS, COMPLETE
BACTERIA: NONE SEEN
Bilirubin,UR: NEGATIVE
GLUCOSE, UR: NEGATIVE mg/dL (ref 0–75)
NITRITE: NEGATIVE
Ph: 6 (ref 4.5–8.0)
SPECIFIC GRAVITY: 1.011 (ref 1.003–1.030)
Transitional Epi: 1

## 2014-09-24 LAB — CBC
HCT: 45.7 % (ref 35.0–47.0)
HGB: 15.7 g/dL (ref 12.0–16.0)
MCH: 35.6 pg — AB (ref 26.0–34.0)
MCHC: 34.3 g/dL (ref 32.0–36.0)
MCV: 104 fL — ABNORMAL HIGH (ref 80–100)
Platelet: 133 10*3/uL — ABNORMAL LOW (ref 150–440)
RBC: 4.4 10*6/uL (ref 3.80–5.20)
RDW: 12.9 % (ref 11.5–14.5)
WBC: 9.9 10*3/uL (ref 3.6–11.0)

## 2014-09-25 LAB — URINE CULTURE

## 2015-06-03 NOTE — L&D Delivery Note (Addendum)
Delivery Note At 1:16 PM a viable and healthy female was delivered via Vaginal, Spontaneous Delivery (Presentation:LOA ;  ).  APGAR: 7, 8; weight  .   Placenta status: intact, 3VC  Cord pH: pending  Anesthesia:  none Episiotomy:  none Lacerations:  none Suture Repair: none Est. Blood Loss (mL): 300  Mom to postpartum.  Baby to Couplet care / Skin to Skin.  IOL for IUGR and oligo with elevated BP in office without other sx. +cocaine and THC and EtOH use in pregnancy - UDS on admission +for cocaine and THC.  Cervical ripening, pitocin induction - progressed to 7cm and began to push- she was coached through breathing, IUPC placed to assist in FHT monitoring with maternal movement for pain control. Nitrous trialed without pain relief. However, she then progressed quickly 10 min after FSE placed, and began to push without being able to stop. The FHT dropped to the 90s for several minutes, and the baby was born over an intact perineum. The cervix was briefly evaluated and found to be intact - the baby was placed on maternal abdomen and spontaneously breathing and crying just after delivery. The placenta delivered spontaneously and was intact. A 3vc was noted and cord gases collected. The placenta will be sent for pathology due to fetal indication and its small size. No perineal lacerations noted.  Elevated BP in labor requiring several doses of iv labetalol over the last several hours with significant pain; her labs were normal and she is asx. We will start 24 hrs of postpartum magnesium for her.  Christeen DouglasBEASLEY, Eola Waldrep 02/19/2016, 1:36 PM

## 2015-08-07 ENCOUNTER — Other Ambulatory Visit: Payer: Self-pay | Admitting: Advanced Practice Midwife

## 2015-08-07 DIAGNOSIS — F1011 Alcohol abuse, in remission: Secondary | ICD-10-CM

## 2015-08-07 DIAGNOSIS — Z87891 Personal history of nicotine dependence: Secondary | ICD-10-CM

## 2015-08-07 LAB — OB RESULTS CONSOLE GBS: STREP GROUP B AG: POSITIVE

## 2015-08-07 LAB — OB RESULTS CONSOLE HIV ANTIBODY (ROUTINE TESTING): HIV: NONREACTIVE

## 2015-08-08 LAB — OB RESULTS CONSOLE HEPATITIS B SURFACE ANTIGEN: HEP B S AG: NEGATIVE

## 2015-08-08 LAB — OB RESULTS CONSOLE RUBELLA ANTIBODY, IGM: Rubella: IMMUNE

## 2015-08-08 LAB — OB RESULTS CONSOLE RPR: RPR: NONREACTIVE

## 2015-08-08 LAB — OB RESULTS CONSOLE VARICELLA ZOSTER ANTIBODY, IGG: VARICELLA IGG: IMMUNE

## 2015-08-09 LAB — OB RESULTS CONSOLE GC/CHLAMYDIA
Chlamydia: NEGATIVE
Gonorrhea: NEGATIVE

## 2015-08-30 ENCOUNTER — Emergency Department
Admission: EM | Admit: 2015-08-30 | Discharge: 2015-08-30 | Disposition: A | Discharge status: Home / Self Care | Payer: Medicaid Other | Attending: Emergency Medicine | Admitting: Emergency Medicine

## 2015-08-30 DIAGNOSIS — O26891 Other specified pregnancy related conditions, first trimester: Secondary | ICD-10-CM | POA: Diagnosis not present

## 2015-08-30 DIAGNOSIS — Z3A14 14 weeks gestation of pregnancy: Secondary | ICD-10-CM | POA: Diagnosis not present

## 2015-08-30 DIAGNOSIS — N390 Urinary tract infection, site not specified: Secondary | ICD-10-CM | POA: Diagnosis not present

## 2015-08-30 DIAGNOSIS — R350 Frequency of micturition: Secondary | ICD-10-CM | POA: Diagnosis present

## 2015-08-30 LAB — URINALYSIS COMPLETE WITH MICROSCOPIC (ARMC ONLY)
Bacteria, UA: NONE SEEN
Bilirubin Urine: NEGATIVE
Glucose, UA: NEGATIVE mg/dL
NITRITE: NEGATIVE
PH: 6 (ref 5.0–8.0)
PROTEIN: 100 mg/dL — AB
SPECIFIC GRAVITY, URINE: 1.017 (ref 1.005–1.030)

## 2015-08-30 MED ORDER — AMOXICILLIN-POT CLAVULANATE 875-125 MG PO TABS: 1.0000 | ORAL_TABLET | Freq: Two times a day (BID) | ORAL | Status: DC

## 2015-08-30 MED ORDER — AMOXICILLIN-POT CLAVULANATE 875-125 MG PO TABS
1.0000 | ORAL_TABLET | Freq: Once | ORAL | Status: AC
  Administered 2015-08-30: 1 via ORAL
  Filled 2015-08-30: qty 1

## 2015-08-30 NOTE — Discharge Instructions (Signed)
°  Please follow up closely with her primary care doctor on Monday.  Return to the emergency room if he develop any severe pelvic pain, vomiting, have a fever, feels "dehydrated", he notices any vaginal bleeding or pelvic pain or other new concerns arise.  Urinary Tract Infection Urinary tract infections (UTIs) can develop anywhere along your urinary tract. Your urinary tract is your body's drainage system for removing wastes and extra water. Your urinary tract includes two kidneys, two ureters, a bladder, and a urethra. Your kidneys are a pair of bean-shaped organs. Each kidney is about the size of your fist. They are located below your ribs, one on each side of your spine. CAUSES Infections are caused by microbes, which are microscopic organisms, including fungi, viruses, and bacteria. These organisms are so small that they can only be seen through a microscope. Bacteria are the microbes that most commonly cause UTIs. SYMPTOMS  Symptoms of UTIs may vary by age and gender of the patient and by the location of the infection. Symptoms in young women typically include a frequent and intense urge to urinate and a painful, burning feeling in the bladder or urethra during urination. Older women and men are more likely to be tired, shaky, and weak and have muscle aches and abdominal pain. A fever may mean the infection is in your kidneys. Other symptoms of a kidney infection include pain in your back or sides below the ribs, nausea, and vomiting. DIAGNOSIS To diagnose a UTI, your caregiver will ask you about your symptoms. Your caregiver will also ask you to provide a urine sample. The urine sample will be tested for bacteria and white blood cells. White blood cells are made by your body to help fight infection. TREATMENT  Typically, UTIs can be treated with medication. Because most UTIs are caused by a bacterial infection, they usually can be treated with the use of antibiotics. The choice of antibiotic and  length of treatment depend on your symptoms and the type of bacteria causing your infection. HOME CARE INSTRUCTIONS  If you were prescribed antibiotics, take them exactly as your caregiver instructs you. Finish the medication even if you feel better after you have only taken some of the medication.  Drink enough water and fluids to keep your urine clear or pale yellow.  Avoid caffeine, tea, and carbonated beverages. They tend to irritate your bladder.  Empty your bladder often. Avoid holding urine for long periods of time.  Empty your bladder before and after sexual intercourse.  After a bowel movement, women should cleanse from front to back. Use each tissue only once. SEEK MEDICAL CARE IF:   You have back pain.  You develop a fever.  Your symptoms do not begin to resolve within 3 days. SEEK IMMEDIATE MEDICAL CARE IF:   You have severe back pain or lower abdominal pain.  You develop chills.  You have nausea or vomiting.  You have continued burning or discomfort with urination. MAKE SURE YOU:   Understand these instructions.  Will watch your condition.  Will get help right away if you are not doing well or get worse.   This information is not intended to replace advice given to you by your health care provider. Make sure you discuss any questions you have with your health care provider.   Document Released: 02/26/2005 Document Revised: 02/07/2015 Document Reviewed: 06/27/2011 Elsevier Interactive Patient Education [employer]2016 Elsevier Inc.

## 2015-08-30 NOTE — ED Provider Notes (Signed)
[facility] Emergency Department Provider Note  ____________________________________________  Time seen: Approximately 2:36 PM  I have reviewed the triage vital signs and the nursing notes.   HISTORY  Chief Complaint Urinary Frequency    HPI Mariah Fox is a 35 y.o. female reports that she feels like she has urinary tract infection.  Patient tells me that for about the last 3-4 days she has noticed burning especially with urination. No vaginal discharge or vaginal bleeding. She denies any "abdominal pain" except states she is achy when she urinates.  She reports she is currently [redacted] weeks pregnant. She has not been experiencing any nausea or vomiting. No fevers or chills. Not having any vaginal bleeding, or abnormal discharge.  She currently takes no medicine. She is under the care of the practitioner for this pregnancy already.  History reviewed. No pertinent past medical history.  There are no active problems to display for this patient.   History reviewed. No pertinent past surgical history.  Current Outpatient Rx  Name  Route  Sig  Dispense  Refill  . amoxicillin-clavulanate (AUGMENTIN) 875-125 MG tablet   Oral   Take 1 tablet by mouth 2 (two) times daily.   20 tablet   0     Allergies Review of patient's allergies indicates no known allergies.  No family history on file.  Social History Social History  Substance Use Topics  . Smoking status: Former Games developer  . Smokeless tobacco: None  . Alcohol Use: No    Review of Systems Constitutional: No fever/chills Eyes: No visual changes. ENT: No sore throat. Cardiovascular: Denies chest pain. Respiratory: Denies shortness of breath. Gastrointestinal: No abdominal pain.  No nausea, no vomiting.  No diarrhea.  No constipation. Genitourinary: Pain and burning and slight abnormal odor with urination Musculoskeletal: Negative for back pain. Skin: Negative for rash. Neurological: Negative  for headaches, focal weakness or numbness.  10-point ROS otherwise negative.  ____________________________________________   PHYSICAL EXAM:  VITAL SIGNS: ED Triage Vitals  Enc Vitals Group     BP 08/30/15 0954 123/88 mmHg     Pulse Rate 08/30/15 0954 112     Resp 08/30/15 0954 16     Temp 08/30/15 0954 98.2 F (36.8 C)     Temp Source 08/30/15 0954 Oral     SpO2 08/30/15 0954 99 %     Weight 08/30/15 0954 138 lb (62.596 kg)     Height 08/30/15 0954  (1.651 m)     Head Cir --      Peak Flow --      Pain Score 08/30/15 0955 8     Pain Loc --      Pain Edu? --      Excl. in GC? --    Constitutional: Alert and oriented. Well appearing and in no acute distress. Eyes: Conjunctivae are normal. PERRL. EOMI. Head: Atraumatic. Nose: No congestion/rhinnorhea. Mouth/Throat: Mucous membranes are moist.   Neck: No stridor.   ardiovascular: Normal rate, regular rhythm. Grossly normal heart sounds.  Good peripheral circulation. Respiratory: Normal respiratory effort.  No retractions. Lungs CTAB. Gastrointestinal: Soft and nontender. No distention.Musculoskeletal: No lower extremity tenderness nor edema.  No joint effusions. No CVA tenderness. No evidence of pyelonephritis. Neurologic:  Normal speech and language. No gross focal neurologic deficits are appreciated. No gait instability. Skin:  Skin is warm, dry and intact. No rash noted. Psychiatric: Mood and affect are normal. Speech and behavior are normal.  ____________________________________________   LABS (all labs  ordered are listed, but only abnormal results are displayed)  Labs Reviewed  URINALYSIS COMPLETEWITH MICROSCOPIC ([facility] ONLY) - Abnormal; Notable for the following:    Color, Urine YELLOW (*)    APPearance CLOUDY (*)    Ketones, ur TRACE (*)    Hgb urine dipstick 3+ (*)    Protein, ur 100 (*)    Leukocytes, UA 3+ (*)    Squamous Epithelial / LPF 0-5 (*)    All other components within normal limits  URINE  CULTURE   ____________________________________________  EKG   ____________________________________________  RADIOLOGY   ____________________________________________   PROCEDURES  Procedure(s) performed: None  Critical Care performed: No  ____________________________________________   INITIAL IMPRESSION / ASSESSMENT AND PLAN / ED COURSE  Pertinent labs & imaging results that were available during my care of the patient were reviewed by me and considered in my medical decision making (see chart for details).  Patient reports [redacted] weeks pregnant. No symptoms seemingly related to gynecologic or obstetric process, however does report dysuria and abnormal odor. Her symptoms and urinalysis appear consistent with uncomplicated UTI. Based on current recommendations reviewed and up-to-date I will place her on Augmentin therapy for treatment given her pregnancy status. No evidence of pyelonephritis or intra-abdominal process. No rebound guarding or peritonitis on exam. In fact, patient's abdominal exam is benign at this time.   Return precautions and treatment recommendations and follow-up discussed with the patient who is agreeable with the plan.  ____________________________________________   FINAL CLINICAL IMPRESSION(S) / ED DIAGNOSES  Final diagnoses:  Acute urinary tract infection      Sharyn CreamerMark Quale, MD 08/30/15 1439

## 2015-08-30 NOTE — ED Notes (Signed)
Pt is [redacted] weeks pregnant and c/o painful urination with blood when she wipes for the past 2-3 days..Marland Kitchen

## 2015-08-30 NOTE — ED Notes (Signed)
Pt presents with complaints of lower central abdominal pain, burning upon urination, urgency and frequency of urination. Pt denies fever. Pt denies nausea and vomiting.

## 2015-09-01 LAB — URINE CULTURE
Culture: 50000
SPECIAL REQUESTS: NORMAL

## 2015-09-02 NOTE — Progress Notes (Signed)
RX called [place] [facility] [address] [place]   Demetrius Charityeldrin D. Aijalon Demuro, PharmD

## 2015-09-02 NOTE — Progress Notes (Addendum)
ED culture results   35 year old pregnant female recently visited the ER for urinary tract infection. Patient was prescribed Augmentin.  Urine culture results showed 50k cfu of Ecoli  Intermediate sensitivity to Unasyn which is the IV equivalent of Augmentin  Recommended switching patient Keflex 500 mg PO BID x10 days. MD Inocencio HomesGayle approved  Called number on file; however went to VM and VM is full.   PCP Phineas Realharles Drew. Will need to follow up with Phineas Realharles Drew on Monday.   Demetrius Charityeldrin D. Anique Beckley, PharmD.

## 2015-10-01 ENCOUNTER — Other Ambulatory Visit: Payer: Self-pay | Admitting: Family Medicine

## 2015-10-01 DIAGNOSIS — O09529 Supervision of elderly multigravida, unspecified trimester: Secondary | ICD-10-CM

## 2015-10-04 ENCOUNTER — Ambulatory Visit
Admission: RE | Admit: 2015-10-04 | Discharge: 2015-10-04 | Disposition: A | Discharge status: Home / Self Care | Payer: Medicaid Other | Source: Ambulatory Visit | Attending: Obstetrics and Gynecology | Admitting: Obstetrics and Gynecology

## 2015-10-04 ENCOUNTER — Other Ambulatory Visit: Payer: Self-pay | Admitting: Obstetrics and Gynecology

## 2015-10-04 DIAGNOSIS — O09529 Supervision of elderly multigravida, unspecified trimester: Secondary | ICD-10-CM

## 2015-10-04 DIAGNOSIS — O09892 Supervision of other high risk pregnancies, second trimester: Secondary | ICD-10-CM | POA: Insufficient documentation

## 2015-10-04 DIAGNOSIS — O09512 Supervision of elderly primigravida, second trimester: Secondary | ICD-10-CM | POA: Diagnosis not present

## 2015-10-04 DIAGNOSIS — Z36 Encounter for antenatal screening of mother: Secondary | ICD-10-CM | POA: Diagnosis present

## 2015-10-04 DIAGNOSIS — O4442 Low lying placenta NOS or without hemorrhage, second trimester: Secondary | ICD-10-CM | POA: Insufficient documentation

## 2015-10-04 DIAGNOSIS — O09522 Supervision of elderly multigravida, second trimester: Secondary | ICD-10-CM | POA: Insufficient documentation

## 2015-10-04 DIAGNOSIS — Z3A18 18 weeks gestation of pregnancy: Secondary | ICD-10-CM | POA: Insufficient documentation

## 2015-10-04 HISTORY — DX: Other specified health status: Z78.9

## 2015-12-13 ENCOUNTER — Ambulatory Visit
Admission: RE | Admit: 2015-12-13 | Discharge: 2015-12-13 | Disposition: A | Discharge status: Home / Self Care | Payer: Medicaid Other | Source: Ambulatory Visit | Attending: Maternal & Fetal Medicine | Admitting: Maternal & Fetal Medicine

## 2015-12-13 DIAGNOSIS — O4442 Low lying placenta NOS or without hemorrhage, second trimester: Secondary | ICD-10-CM

## 2015-12-13 DIAGNOSIS — Z3483 Encounter for supervision of other normal pregnancy, third trimester: Secondary | ICD-10-CM | POA: Insufficient documentation

## 2015-12-13 DIAGNOSIS — Z3A28 28 weeks gestation of pregnancy: Secondary | ICD-10-CM | POA: Insufficient documentation

## 2016-01-31 ENCOUNTER — Inpatient Hospital Stay
Admission: EM | Admit: 2016-01-31 | Discharge: 2016-01-31 | Disposition: A | Discharge status: Home / Self Care | Payer: Medicaid Other | Attending: Obstetrics and Gynecology | Admitting: Obstetrics and Gynecology

## 2016-01-31 ENCOUNTER — Encounter: Payer: Self-pay | Admitting: *Deleted

## 2016-01-31 DIAGNOSIS — O4443 Low lying placenta NOS or without hemorrhage, third trimester: Secondary | ICD-10-CM | POA: Diagnosis not present

## 2016-01-31 DIAGNOSIS — O99323 Drug use complicating pregnancy, third trimester: Secondary | ICD-10-CM | POA: Diagnosis not present

## 2016-01-31 DIAGNOSIS — O26893 Other specified pregnancy related conditions, third trimester: Secondary | ICD-10-CM | POA: Insufficient documentation

## 2016-01-31 DIAGNOSIS — O09523 Supervision of elderly multigravida, third trimester: Secondary | ICD-10-CM | POA: Insufficient documentation

## 2016-01-31 DIAGNOSIS — Z3A35 35 weeks gestation of pregnancy: Secondary | ICD-10-CM | POA: Insufficient documentation

## 2016-01-31 DIAGNOSIS — O4442 Low lying placenta NOS or without hemorrhage, second trimester: Secondary | ICD-10-CM

## 2016-01-31 DIAGNOSIS — Z87891 Personal history of nicotine dependence: Secondary | ICD-10-CM | POA: Diagnosis not present

## 2016-01-31 DIAGNOSIS — R030 Elevated blood-pressure reading, without diagnosis of hypertension: Secondary | ICD-10-CM | POA: Diagnosis present

## 2016-01-31 DIAGNOSIS — F1410 Cocaine abuse, uncomplicated: Secondary | ICD-10-CM | POA: Diagnosis not present

## 2016-01-31 DIAGNOSIS — O139 Gestational [pregnancy-induced] hypertension without significant proteinuria, unspecified trimester: Secondary | ICD-10-CM

## 2016-01-31 LAB — COMPREHENSIVE METABOLIC PANEL
ALBUMIN: 2.9 g/dL — AB (ref 3.5–5.0)
ALT: 7 U/L — ABNORMAL LOW (ref 14–54)
AST: 16 U/L (ref 15–41)
Alkaline Phosphatase: 158 U/L — ABNORMAL HIGH (ref 38–126)
Anion gap: 6 (ref 5–15)
CHLORIDE: 109 mmol/L (ref 101–111)
CO2: 22 mmol/L (ref 22–32)
Calcium: 8.6 mg/dL — ABNORMAL LOW (ref 8.9–10.3)
Creatinine, Ser: 0.52 mg/dL (ref 0.44–1.00)
GFR calc Af Amer: >60 mL/min (ref >60)
Glucose, Bld: 97 mg/dL (ref 65–99)
POTASSIUM: 3.6 mmol/L (ref 3.5–5.1)
SODIUM: 137 mmol/L (ref 135–145)
Total Bilirubin: 0.4 mg/dL (ref 0.3–1.2)
Total Protein: 5.8 g/dL — ABNORMAL LOW (ref 6.5–8.1)

## 2016-01-31 LAB — CBC
HEMATOCRIT: 38.8 % (ref 35.0–47.0)
Hemoglobin: 13.5 g/dL (ref 12.0–16.0)
MCH: 34.7 pg — ABNORMAL HIGH (ref 26.0–34.0)
MCHC: 34.8 g/dL (ref 32.0–36.0)
MCV: 99.7 fL (ref 80.0–100.0)
Platelets: 152 10*3/uL (ref 150–440)
RBC: 3.89 MIL/uL (ref 3.80–5.20)
RDW: 13.9 % (ref 11.5–14.5)
WBC: 6.1 10*3/uL (ref 3.6–11.0)

## 2016-01-31 LAB — PROTEIN / CREATININE RATIO, URINE
Creatinine, Urine: 55 mg/dL
Total Protein, Urine: <6 mg/dL

## 2016-01-31 MED ORDER — LABETALOL HCL 5 MG/ML IV SOLN: 20.0000 mg | INTRAVENOUS | Status: DC | PRN

## 2016-01-31 MED ORDER — HYDRALAZINE HCL 20 MG/ML IJ SOLN: 10.0000 mg | Freq: Once | INTRAMUSCULAR | Status: DC | PRN

## 2016-01-31 NOTE — OB Triage Note (Signed)
Pt. Sent over from [facility] for increased BP.  Milon Scorearon Jones notified by HD nurse, orders received. Urine sample collected and will be sent to lab for PCR, lab called to draw labs ordered, pt. Connected to U/S - FHR 140s, no uterine contractions palpated - toco applied.  Pt. States she does feel the baby move; positive for fetal movement. RN will continue to triage patient and notify Yetta BarreJones, CNM that patient is here. Pt. Denies headache, blurred vision (visual changes), nor epigastric pain. Denies wt. Gain in the past week.

## 2016-01-31 NOTE — H&P (Signed)
Obstetrics Admission History & Physical  Referring Provider: [facility]  [facility]    Sent from [facility] today for elevated BP and needs labs, Pt is attending [facility] for cocaine abuse. Denies use today.   History of Present Illness  35 y.o. [id number] @ 7726w3d (by LMP of12/26/2016 w/ EDD o f 03/03/16 US at 12 6/7 C/W LMP. Pregnancy complicated by: cocaine abuse and hypertension today at [facility]. Pt sent here for prot/creat ratio  Mariah Fox presents for elevated BP's today  Review of Systems: Positive for BP elevated Otherwise, her 12 point review of systems is negative or as noted in the History of Present Illness.  Patient Active Problem List   Diagnosis Date Noted  . Gestational hypertension 01/31/2016  . Low lying placenta nos or without hemorrhage, second trimester 10/04/2015  . Elderly multigravida in second trimester 10/04/2015   Placental previa has resolved and is 1 cm from the os PMHx:  Past Medical History:  Diagnosis Date  . Medical history non-contributory   Cocaine Abuse PSHx:  Past Surgical History:  Procedure Laterality Date  . NO PAST SURGERIES     Medications:  Prescriptions Prior to Admission  Medication Sig Dispense Refill Last Dose  . Prenatal Vit-Fe Fumarate-FA (PRENATAL MULTIVITAMIN) TABS tablet Take 1 tablet by mouth daily at 12 noon.   Past Month at Unknown time   Allergies: has No Known Allergies. OBHx:  OB History  Gravida Para Term Preterm AB Living  4 2 2   1 2   SAB TAB Ectopic Multiple Live Births          2    # Outcome Date GA Lbr Len/2nd Weight Sex Delivery Anes PTL Lv  4 Current           3 Term 01/05/04    F Vag-Spont   LIV  2 AB 2004          1 Term 07/24/00    F Vag-Spont   LIV      GYNHx:  History of abnormal pap smears:not found History of STIs:not found           FHx: History reviewed. No pertinent family history. Soc Hx:  Social History   Social History  . Marital status: Single    Spouse name: N/A  . Number  of children: N/A  . Years of education: N/A   Occupational History  . Not on file.   Social History Main Topics  . Smoking status: Former Games developermoker  . Smokeless tobacco: Never Used  . Alcohol use No  . Drug use:     Types: Cocaine, Marijuana  . Sexual activity: Yes   Other Topics Concern  . Not on file   Social History Narrative  . No narrative on file   FOB is giving pt a hard time per pt   Objective   Vitals:   01/31/16 1421 01/31/16 1436  BP: (!) 143/93 139/80  Pulse: 62 70  Resp:    Temp:     Temp:  [98.5 F (36.9 C)] 98.5 F (36.9 C) (08/31 1321) Pulse Rate:  [62-72] 70 (08/31 1436) Resp:  [18] 18 (08/31 1341) BP: (139-162)/(80-94) 139/80 (08/31 1436) SpO2:  [99 %-100 %] 99 % (08/31 1525) Temp (24hrs), Avg:98.5 F (36.9 C), Min:98.5 F (36.9 C), Max:98.5 F (36.9 C)  No intake or output data in the 24 hours ending 01/31/16 1542    Current Vital Signs 24h Vital Sign Ranges  T 98.5 F (  36.9 C) Temp  Avg: 98.5 F (36.9 C)  Min: 98.5 F (36.9 C)  Max: 98.5 F (36.9 C)  BP 139/80 BP  Min: 139/80  Max: 162/94  HR 70 Pulse  Avg: 66.5  Min: 62  Max: 72  RR 18 Resp  Avg: 18  Min: 18  Max: 18  SaO2 99 %   SpO2  Avg: 99.4 %  Min: 99 %  Max: 100 %       24 Hour I/O Current Shift I/O  Time Ins Outs No intake/output data recorded. No intake/output data recorded.   EFM: 140, REactive with 2 accels 15 x 15 BPM  Toco: No UC's noted.  General: Well nourished, well developed female in no acute distress.  Skin:  Warm and dry.  Cardiovascular: S1S2, RRR, No M/R/G. Respiratory:  Clear to auscultation bilateral. Normal respiratory effort. No W/R/R. Abdomen: Gravid.  Neuro/Psych:  Normal mood and affect.    SVE: Not done Leopolds/EFW: 27% by [place] per [facility]  Labs  Protein/Creatinine ratio: Below the reportable range Ultrasounds: Anatomy scan 12/13/15 Low lying placenta resolved.   Perinatal info  HBSAG: Neg, A pos, Antibody eng, GBS pos, RPR NR, GC/CH neg, AFP  ng, 1 h GCt 108, HIV NR  Assessment & Plan   35 y.o. [id number] @ [redacted]w[redacted]d with signs and symptoms, with likely Gest HTN or Cocaine Abuse with elevated HTN.  IUP: at 35 2/7 weeks GBS: pos in urine Pt encouraged to fu at [facility] next week. Counsled re: cocaine can cause fetal death, maternal death, MI, heart palpitations. Pt states she is aware and does not use cocaine like that.  DC home to rest. FKC's. FU as scheduled at [facility].    Sharee Pimple, MSN, CNM, FNP [facility]

## 2016-01-31 NOTE — Discharge Instructions (Signed)
Discharge instructions reviewed with patient, labor precautions reviewed, pt. Verbalized understanding.  Encouraged pt. To follow up with scheduled OB appointments at the [facility] pt. In agreement.  Pt. Signed copy and copy given.

## 2016-02-18 ENCOUNTER — Inpatient Hospital Stay
Admission: EM | Admit: 2016-02-18 | Discharge: 2016-02-21 | DRG: 775 | Disposition: A | Discharge status: Home / Self Care | Payer: Medicaid Other | Attending: Obstetrics and Gynecology | Admitting: Obstetrics and Gynecology

## 2016-02-18 DIAGNOSIS — O99314 Alcohol use complicating childbirth: Secondary | ICD-10-CM | POA: Diagnosis present

## 2016-02-18 DIAGNOSIS — F1290 Cannabis use, unspecified, uncomplicated: Secondary | ICD-10-CM | POA: Diagnosis present

## 2016-02-18 DIAGNOSIS — F1410 Cocaine abuse, uncomplicated: Secondary | ICD-10-CM | POA: Diagnosis present

## 2016-02-18 DIAGNOSIS — O99324 Drug use complicating childbirth: Secondary | ICD-10-CM | POA: Diagnosis present

## 2016-02-18 DIAGNOSIS — Z23 Encounter for immunization: Secondary | ICD-10-CM

## 2016-02-18 DIAGNOSIS — O365930 Maternal care for other known or suspected poor fetal growth, third trimester, not applicable or unspecified: Secondary | ICD-10-CM | POA: Diagnosis present

## 2016-02-18 DIAGNOSIS — Z3A38 38 weeks gestation of pregnancy: Secondary | ICD-10-CM | POA: Diagnosis not present

## 2016-02-18 DIAGNOSIS — O365990 Maternal care for other known or suspected poor fetal growth, unspecified trimester, not applicable or unspecified: Secondary | ICD-10-CM | POA: Diagnosis present

## 2016-02-18 DIAGNOSIS — O4442 Low lying placenta NOS or without hemorrhage, second trimester: Secondary | ICD-10-CM

## 2016-02-18 DIAGNOSIS — O4103X0 Oligohydramnios, third trimester, not applicable or unspecified: Principal | ICD-10-CM | POA: Diagnosis present

## 2016-02-18 LAB — COMPREHENSIVE METABOLIC PANEL
ALT: 6 U/L — ABNORMAL LOW (ref 14–54)
ANION GAP: 9 (ref 5–15)
AST: 18 U/L (ref 15–41)
Albumin: 2.9 g/dL — ABNORMAL LOW (ref 3.5–5.0)
Alkaline Phosphatase: 172 U/L — ABNORMAL HIGH (ref 38–126)
BILIRUBIN TOTAL: 0.5 mg/dL (ref 0.3–1.2)
CHLORIDE: 107 mmol/L (ref 101–111)
CO2: 21 mmol/L — ABNORMAL LOW (ref 22–32)
Calcium: 8.6 mg/dL — ABNORMAL LOW (ref 8.9–10.3)
Creatinine, Ser: 0.57 mg/dL (ref 0.44–1.00)
Glucose, Bld: 77 mg/dL (ref 65–99)
POTASSIUM: 3.5 mmol/L (ref 3.5–5.1)
Sodium: 137 mmol/L (ref 135–145)
TOTAL PROTEIN: 5.7 g/dL — AB (ref 6.5–8.1)

## 2016-02-18 LAB — TYPE AND SCREEN
ABO/RH(D): A POS
ANTIBODY SCREEN: NEGATIVE

## 2016-02-18 LAB — CBC
HEMATOCRIT: 41.8 % (ref 35.0–47.0)
Hemoglobin: 14.5 g/dL (ref 12.0–16.0)
MCH: 34.8 pg — AB (ref 26.0–34.0)
MCHC: 34.8 g/dL (ref 32.0–36.0)
MCV: 100.1 fL — AB (ref 80.0–100.0)
PLATELETS: 165 10*3/uL (ref 150–440)
RBC: 4.17 MIL/uL (ref 3.80–5.20)
RDW: 14.2 % (ref 11.5–14.5)
WBC: 6.4 10*3/uL (ref 3.6–11.0)

## 2016-02-18 LAB — URIC ACID: URIC ACID, SERUM: 4.2 mg/dL (ref 2.3–6.6)

## 2016-02-18 LAB — URINE DRUG SCREEN, QUALITATIVE (ARMC ONLY)
AMPHETAMINES, UR SCREEN: NOT DETECTED
Barbiturates, Ur Screen: NOT DETECTED
Benzodiazepine, Ur Scrn: NOT DETECTED
CANNABINOID 50 NG, UR ~~LOC~~: POSITIVE — AB
COCAINE METABOLITE, UR ~~LOC~~: POSITIVE — AB
MDMA (ECSTASY) UR SCREEN: NOT DETECTED
Methadone Scn, Ur: NOT DETECTED
Opiate, Ur Screen: NOT DETECTED
PHENCYCLIDINE (PCP) UR S: NOT DETECTED
Tricyclic, Ur Screen: NOT DETECTED

## 2016-02-18 LAB — PROTEIN / CREATININE RATIO, URINE
Creatinine, Urine: 17 mg/dL
Total Protein, Urine: <6 mg/dL

## 2016-02-18 MED ORDER — SODIUM CHLORIDE 0.9 % IV SOLN
1.0000 g | INTRAVENOUS | Status: DC
  Administered 2016-02-18 – 2016-02-19 (×4): 1 g via INTRAVENOUS
  Filled 2016-02-18 (×10): qty 1000

## 2016-02-18 MED ORDER — OXYCODONE-ACETAMINOPHEN 5-325 MG PO TABS
1.0000 | ORAL_TABLET | ORAL | Status: DC | PRN
  Administered 2016-02-19 (×2): 1 via ORAL
  Filled 2016-02-18 (×2): qty 1

## 2016-02-18 MED ORDER — OXYCODONE-ACETAMINOPHEN 5-325 MG PO TABS
2.0000 | ORAL_TABLET | ORAL | Status: DC | PRN
  Administered 2016-02-20: 2 via ORAL
  Filled 2016-02-18: qty 2

## 2016-02-18 MED ORDER — ONDANSETRON HCL 4 MG/2ML IJ SOLN: 4.0000 mg | Freq: Four times a day (QID) | INTRAMUSCULAR | Status: DC | PRN

## 2016-02-18 MED ORDER — LIDOCAINE HCL (PF) 1 % IJ SOLN
30.0000 mL | INTRAMUSCULAR | Status: DC | PRN
  Filled 2016-02-18: qty 30

## 2016-02-18 MED ORDER — OXYTOCIN BOLUS FROM INFUSION: 500.0000 mL | Freq: Once | INTRAVENOUS | Status: DC

## 2016-02-18 MED ORDER — TERBUTALINE SULFATE 1 MG/ML IJ SOLN: 0.2500 mg | Freq: Once | INTRAMUSCULAR | Status: DC | PRN

## 2016-02-18 MED ORDER — SOD CITRATE-CITRIC ACID 500-334 MG/5ML PO SOLN
30.0000 mL | ORAL | Status: DC | PRN
  Filled 2016-02-18: qty 30

## 2016-02-18 MED ORDER — OXYTOCIN 40 UNITS IN LACTATED RINGERS INFUSION - SIMPLE MED
2.5000 [IU]/h | INTRAVENOUS | Status: DC
  Filled 2016-02-18 (×2): qty 1000

## 2016-02-18 MED ORDER — OXYTOCIN 40 UNITS IN LACTATED RINGERS INFUSION - SIMPLE MED
1.0000 m[IU]/min | INTRAVENOUS | Status: DC
  Administered 2016-02-18: 1 m[IU]/min via INTRAVENOUS

## 2016-02-18 MED ORDER — DIPHENHYDRAMINE HCL 25 MG PO CAPS: 50.0000 mg | ORAL_CAPSULE | Freq: Every evening | ORAL | Status: DC | PRN

## 2016-02-18 MED ORDER — DINOPROSTONE 10 MG VA INST
10.0000 mg | VAGINAL_INSERT | Freq: Once | VAGINAL | Status: AC
  Administered 2016-02-18: 10 mg via VAGINAL
  Filled 2016-02-18: qty 1

## 2016-02-18 MED ORDER — LABETALOL HCL 5 MG/ML IV SOLN
20.0000 mg | Freq: Once | INTRAVENOUS | Status: AC
  Administered 2016-02-18: 20 mg via INTRAVENOUS
  Filled 2016-02-18: qty 4

## 2016-02-18 MED ORDER — ACETAMINOPHEN 325 MG PO TABS: 650.0000 mg | ORAL_TABLET | ORAL | Status: DC | PRN

## 2016-02-18 MED ORDER — SODIUM CHLORIDE 0.9 % IV SOLN
2.0000 g | INTRAVENOUS | Status: DC
  Administered 2016-02-18 (×2): 2 g via INTRAVENOUS
  Filled 2016-02-18 (×2): qty 2000

## 2016-02-18 MED ORDER — LACTATED RINGERS IV SOLN
500.0000 mL | INTRAVENOUS | Status: DC | PRN
Start: 2016-02-18 — End: 2016-02-21

## 2016-02-18 MED ORDER — BUTORPHANOL TARTRATE 1 MG/ML IJ SOLN: 1.0000 mg | INTRAMUSCULAR | Status: DC | PRN

## 2016-02-18 MED ORDER — LACTATED RINGERS IV SOLN
INTRAVENOUS | Status: DC
  Administered 2016-02-18 – 2016-02-19 (×3): via INTRAVENOUS

## 2016-02-18 MED ORDER — LABETALOL HCL 5 MG/ML IV SOLN
20.0000 mg | INTRAVENOUS | Status: DC | PRN
  Administered 2016-02-18 – 2016-02-19 (×5): 20 mg via INTRAVENOUS
  Filled 2016-02-18 (×6): qty 4

## 2016-02-18 NOTE — Progress Notes (Signed)
Mariah ClinesLaketha M Fox is a 35 y.o. [id number] at 38w0 IUGR / oligo , Pitocin at 4711 mu/min still with inadequate ctx . + drug screen today + MJ +cocaine .  PIH labs wnl , but BP is elevated and is being control with IV labetalol.   Subjective:   Objective: BP (!) 146/98 (BP Location: Left Arm)   Pulse 70   Temp 98.4 F (36.9 C) (Oral)   Resp 18   Ht 5' 4.5" (1.638 m)   Wt 155 lb (70.3 kg)   BMI 26.19 kg/m  I/O last 3 completed shifts: In: 832.8 [I.V.:732.8; IV Piggyback:100] Out: -  No intake/output data recorded.    FHT:  FHR: 130 bpm, variability: decreased,  accelerations:  Present,  decelerations:  Present mild variables  UC:   irregular, every 5-7 minutes SVE:    1 cm / 80 / -2   Labs: Lab Results  Component Value Date   WBC 6.4 02/18/2016   HGB 14.5 02/18/2016   HCT 41.8 02/18/2016   MCV 100.1 (H) 02/18/2016   PLT 165 02/18/2016    Assessment / Plan: No evidence of preeclampsia just gest HTN ( drug use related??) IUGR and oligohydramnios . Will stop Pitocin and allow dinner and place cervidil tonight  Continuous monitoring  Miguel Medal 02/18/2016, 7:20 PM

## 2016-02-18 NOTE — H&P (Signed)
Mariah Fox is a 35 y.o. female  10838 + 0 week based on u/s presenting for  IUGR 1%( 2201gm) And oligohydramnios 3 cm [facility] . + Cocaine , MJ and Etoh use in pregnancy . Denies vision change , h/a . BP elevated in office .   OB History    Gravida Para Term Preterm AB Living   4 2 2   1 2    SAB TAB Ectopic Multiple Live Births           2     Past Medical History:  Diagnosis Date  . Medical history non-contributory    Past Surgical History:  Procedure Laterality Date  . NO PAST SURGERIES     Family History: family history is not on file. Social History:  reports that she has quit smoking. She has never used smokeless tobacco. She reports that she uses drugs, including Cocaine and Marijuana. She reports that she does not drink alcohol. Drank ETOH within the week     Maternal Diabetes: no results Genetic Screening: no results Maternal Ultrasounds/Referrals: none Fetal Ultrasounds or other Referrals:  Other:  IUGR  Maternal Substance Abuse:  Yes:  Type: Marijuana, Cocaine, Other: ETOH Significant Maternal Medications:   Significant Maternal Lab Results:   Other Comments:  None  ROS History   Blood pressure (!) 178/103, pulse 67. Exam  Lungs CTA  cv RRR abd soft NT  cx 1 cm 80/-2 VTX  Physical Exam   NST 120's + accels , mild variable decles Prenatal labs: ABO, Rh:  A+ Antibody:  neg  Rubella:  imm RPR:   nr HBsAg:   neg HIV:   neg GBS:   +   Assessment/Plan: IUGR , oligohydramnos, drug use  At risk pregnancy for stillbirth  Admit to L+D  Induce labor Start ampicillin for GBS status  Notify neonatology of mothers drug use   PIH panel and if severe criteria start Magnesium   SCHERMERHORN,THOMAS 02/18/2016, 1:16 PM

## 2016-02-18 NOTE — OB Triage Note (Signed)
[name] sent over from office for evaluation following elevated BP and low AFI, IUGR

## 2016-02-19 LAB — RPR: RPR Ser Ql: NONREACTIVE

## 2016-02-19 MED ORDER — DIPHENHYDRAMINE HCL 25 MG PO CAPS: 25.0000 mg | ORAL_CAPSULE | Freq: Four times a day (QID) | ORAL | Status: DC | PRN

## 2016-02-19 MED ORDER — FLEET ENEMA 7-19 GM/118ML RE ENEM: 1.0000 | ENEMA | Freq: Every day | RECTAL | Status: DC | PRN

## 2016-02-19 MED ORDER — WITCH HAZEL-GLYCERIN EX PADS: 1.0000 "application " | MEDICATED_PAD | CUTANEOUS | Status: DC | PRN

## 2016-02-19 MED ORDER — HYDRALAZINE HCL 20 MG/ML IJ SOLN: 5.0000 mg | Freq: Once | INTRAMUSCULAR | Status: DC | PRN

## 2016-02-19 MED ORDER — TETANUS-DIPHTH-ACELL PERTUSSIS 5-2.5-18.5 LF-MCG/0.5 IM SUSP: 0.5000 mL | Freq: Once | INTRAMUSCULAR | Status: DC

## 2016-02-19 MED ORDER — BISACODYL 10 MG RE SUPP: 10.0000 mg | Freq: Every day | RECTAL | Status: DC | PRN

## 2016-02-19 MED ORDER — MAGNESIUM SULFATE BOLUS VIA INFUSION
4.0000 g | Freq: Once | INTRAVENOUS | Status: DC
  Filled 2016-02-19: qty 500

## 2016-02-19 MED ORDER — MEASLES, MUMPS & RUBELLA VAC ~~LOC~~ INJ
0.5000 mL | INJECTION | Freq: Once | SUBCUTANEOUS | Status: DC
  Filled 2016-02-19: qty 0.5

## 2016-02-19 MED ORDER — SODIUM CHLORIDE 0.9 % IV SOLN: 250.0000 mL | INTRAVENOUS | Status: DC | PRN

## 2016-02-19 MED ORDER — PRENATAL MULTIVITAMIN CH
1.0000 | ORAL_TABLET | Freq: Every day | ORAL | Status: DC
  Administered 2016-02-20 – 2016-02-21 (×2): 1 via ORAL
  Filled 2016-02-19 (×2): qty 1

## 2016-02-19 MED ORDER — SODIUM CHLORIDE 0.9% FLUSH
3.0000 mL | INTRAVENOUS | Status: DC | PRN
  Administered 2016-02-20: 3 mL via INTRAVENOUS
  Filled 2016-02-19: qty 3

## 2016-02-19 MED ORDER — SENNOSIDES-DOCUSATE SODIUM 8.6-50 MG PO TABS
2.0000 | ORAL_TABLET | ORAL | Status: DC
  Administered 2016-02-20: 2 via ORAL
  Filled 2016-02-19: qty 2

## 2016-02-19 MED ORDER — ONDANSETRON HCL 4 MG/2ML IJ SOLN: 4.0000 mg | INTRAMUSCULAR | Status: DC | PRN

## 2016-02-19 MED ORDER — DIBUCAINE 1 % RE OINT: 1.0000 "application " | TOPICAL_OINTMENT | RECTAL | Status: DC | PRN

## 2016-02-19 MED ORDER — MISOPROSTOL 200 MCG PO TABS
ORAL_TABLET | ORAL | Status: AC
  Filled 2016-02-19: qty 4

## 2016-02-19 MED ORDER — ACETAMINOPHEN 325 MG PO TABS: 650.0000 mg | ORAL_TABLET | ORAL | Status: DC | PRN

## 2016-02-19 MED ORDER — SIMETHICONE 80 MG PO CHEW: 80.0000 mg | CHEWABLE_TABLET | ORAL | Status: DC | PRN

## 2016-02-19 MED ORDER — TERBUTALINE SULFATE 1 MG/ML IJ SOLN: 0.2500 mg | Freq: Once | INTRAMUSCULAR | Status: DC | PRN

## 2016-02-19 MED ORDER — OXYTOCIN 10 UNIT/ML IJ SOLN
INTRAMUSCULAR | Status: AC
  Filled 2016-02-19: qty 2

## 2016-02-19 MED ORDER — IBUPROFEN 600 MG PO TABS
600.0000 mg | ORAL_TABLET | Freq: Four times a day (QID) | ORAL | Status: DC
  Administered 2016-02-19 – 2016-02-21 (×7): 600 mg via ORAL
  Filled 2016-02-19 (×7): qty 1

## 2016-02-19 MED ORDER — ONDANSETRON HCL 4 MG PO TABS: 4.0000 mg | ORAL_TABLET | ORAL | Status: DC | PRN

## 2016-02-19 MED ORDER — COCONUT OIL OIL: 1.0000 "application " | TOPICAL_OIL | Status: DC | PRN

## 2016-02-19 MED ORDER — SODIUM CHLORIDE 0.9% FLUSH: 3.0000 mL | Freq: Two times a day (BID) | INTRAVENOUS | Status: DC

## 2016-02-19 MED ORDER — OXYTOCIN 40 UNITS IN LACTATED RINGERS INFUSION - SIMPLE MED
1.0000 m[IU]/min | INTRAVENOUS | Status: DC
  Administered 2016-02-19: 2 m[IU]/min via INTRAVENOUS

## 2016-02-19 MED ORDER — HYDRALAZINE HCL 20 MG/ML IJ SOLN: 10.0000 mg | Freq: Once | INTRAMUSCULAR | Status: DC | PRN

## 2016-02-19 MED ORDER — MAGNESIUM SULFATE 50 % IJ SOLN
2.0000 g/h | INTRAVENOUS | Status: DC
  Filled 2016-02-19: qty 80

## 2016-02-19 MED ORDER — ZOLPIDEM TARTRATE 5 MG PO TABS: 5.0000 mg | ORAL_TABLET | Freq: Every evening | ORAL | Status: DC | PRN

## 2016-02-19 MED ORDER — BENZOCAINE-MENTHOL 20-0.5 % EX AERO: 1.0000 "application " | INHALATION_SPRAY | CUTANEOUS | Status: DC | PRN

## 2016-02-19 MED ORDER — LIDOCAINE HCL (PF) 1 % IJ SOLN
INTRAMUSCULAR | Status: AC
  Filled 2016-02-19: qty 30

## 2016-02-19 MED ORDER — AMMONIA AROMATIC IN INHA
RESPIRATORY_TRACT | Status: AC
  Filled 2016-02-19: qty 10

## 2016-02-20 LAB — COMPREHENSIVE METABOLIC PANEL
ALBUMIN: 2.3 g/dL — AB (ref 3.5–5.0)
ALT: 5 U/L — AB (ref 14–54)
AST: 18 U/L (ref 15–41)
Alkaline Phosphatase: 134 U/L — ABNORMAL HIGH (ref 38–126)
Anion gap: 5 (ref 5–15)
BILIRUBIN TOTAL: 0.5 mg/dL (ref 0.3–1.2)
CALCIUM: 8.3 mg/dL — AB (ref 8.9–10.3)
CHLORIDE: 107 mmol/L (ref 101–111)
CO2: 24 mmol/L (ref 22–32)
CREATININE: 0.57 mg/dL (ref 0.44–1.00)
GFR calc Af Amer: >60 mL/min (ref >60)
GFR calc non Af Amer: >60 mL/min (ref >60)
GLUCOSE: 70 mg/dL (ref 65–99)
POTASSIUM: 3.2 mmol/L — AB (ref 3.5–5.1)
Sodium: 136 mmol/L (ref 135–145)
TOTAL PROTEIN: 4.7 g/dL — AB (ref 6.5–8.1)

## 2016-02-20 LAB — CBC
HEMATOCRIT: 33.9 % — AB (ref 35.0–47.0)
Hemoglobin: 11.9 g/dL — ABNORMAL LOW (ref 12.0–16.0)
MCH: 35.5 pg — ABNORMAL HIGH (ref 26.0–34.0)
MCHC: 35.1 g/dL (ref 32.0–36.0)
MCV: 101.1 fL — AB (ref 80.0–100.0)
PLATELETS: 139 10*3/uL — AB (ref 150–440)
RBC: 3.35 MIL/uL — AB (ref 3.80–5.20)
RDW: 13.7 % (ref 11.5–14.5)
WBC: 8.7 10*3/uL (ref 3.6–11.0)

## 2016-02-20 LAB — PROTEIN / CREATININE RATIO, URINE
CREATININE, URINE: 99 mg/dL
PROTEIN CREATININE RATIO: 0.06 mg/mg{creat} (ref 0.00–0.15)
TOTAL PROTEIN, URINE: 6 mg/dL

## 2016-02-20 MED ORDER — LABETALOL HCL 100 MG PO TABS
100.0000 mg | ORAL_TABLET | Freq: Two times a day (BID) | ORAL | Status: DC
  Administered 2016-02-20 – 2016-02-21 (×2): 100 mg via ORAL
  Filled 2016-02-20 (×2): qty 1

## 2016-02-20 NOTE — Clinical Social Work Note (Signed)
The following is the CSW documentation placed on patient's newborn's chart: [facility] MATERNAL/CHILD NOTE  Patient Details  Name: Mariah Fox MRN: [id number] Date of Birth: 02/19/2017  Date:  02/20/2016  Clinical Social Worker Initiating Note:  Shela Leff MSW,LCSW         Date/ Time Initiated:  02/20/16/                 Child's Name:      Legal Guardian:  Mother   Need for Interpreter:  None   Date of Referral:  02/19/16     Reason for Referral:  Current Substance Use/Substance Use During Pregnancy    Referral Source:  NICU   Address:     Phone number:      Household Members: Self, Minor Children, Parents   Natural Supports (not living in the home):     Professional Supports:None   Employment:    Type of Work:     Education:      Museum/gallery curator Resources:Medicaid   Other Resources: [facility]   Cultural/Religious Considerations Which May Impact Care: none  Strengths: Ability to meet basic needs , Home prepared for child , Understanding of illness   Risk Factors/Current Problems: Substance Use    Cognitive State: Alert    Mood/Affect: Calm , Flat    CSW Assessment:CSW met with patient's mother this morning and patient's mother allowed her sister to remain in the room. Patient's mother reports she lives alone with her children. She has two daughters ages 62 and 42. She states that she has most necessities for her newborn and is going to get her pack and play from the health department. She reports she has support from her family and friends and that they are assisting with caring for her other two children while she is in the hospital. She states she has no concerns regarding transportation. She reports no history of mental illness. She reports frequent drug use throughout her pregnancy and reports marijuana use for anxiety and appetite stimulant and cocaine as recreational. She reports that she used cocaine  approximately 1-2 times every 2 weeks. She denies using illicit substances in the presence of her other two children. CSW explained reporting positive results of drug screen as it pertains to the law and she verbalized understanding. CSW has made [place] report. Patient's mother is aware CSW will continue to follow.   CSW Plan/Description: Child Protective Service Report , Psychosocial Support and Ongoing Assessment of Needs, Patient/Family Education     [place], [place] 02/20/2016, 2:49 PM

## 2016-02-20 NOTE — Progress Notes (Addendum)
Post Partum Day 1+ cocaine use +mj , Baby with + drug screen  Subjective: no complaints  Objective: Blood pressure 130/85, pulse 62, temperature 98.5 F (36.9 C), temperature source Oral, resp. rate 18, height 5' 4.5" (1.638 m), weight 148 lb (67.1 kg), SpO2 100 %, unknown if currently breastfeeding.  Physical Exam:  General: alert and cooperative Lochia: appropriate Uterine Fundus: firm Incision: n/a DVT Evaluation: No evidence of DVT seen on physical exam.   Recent Labs  02/18/16 1329 02/20/16 0549  HGB 14.5 11.9*  HCT 41.8 33.9*    Assessment/Plan: Plan for discharge tomorrow socail services involved   LOS: 2 days   Mariah Fox 02/20/2016, 11:28 AM

## 2016-02-21 MED ORDER — MEDROXYPROGESTERONE ACETATE 150 MG/ML IM SUSP
150.0000 mg | Freq: Once | INTRAMUSCULAR | Status: AC
  Administered 2016-02-21: 150 mg via INTRAMUSCULAR
  Filled 2016-02-21: qty 1

## 2016-02-21 MED ORDER — INFLUENZA VAC SPLIT QUAD 0.5 ML IM SUSY: 0.5000 mL | PREFILLED_SYRINGE | INTRAMUSCULAR | Status: DC

## 2016-02-21 MED ORDER — INFLUENZA VAC SPLIT QUAD 0.5 ML IM SUSY
0.5000 mL | PREFILLED_SYRINGE | INTRAMUSCULAR | Status: AC
  Administered 2016-02-21: 0.5 mL via INTRAMUSCULAR
  Filled 2016-02-21: qty 0.5

## 2016-02-21 NOTE — Discharge Instructions (Signed)
Anemia, Nonspecific Anemia is a condition in which the concentration of red blood cells or hemoglobin in the blood is below normal. Hemoglobin is a substance in red blood cells that carries oxygen to the tissues of the body. Anemia results in not enough oxygen reaching these tissues.  CAUSES  Common causes of anemia include:   Excessive bleeding. Bleeding may be internal or external. This includes excessive bleeding from periods (in women) or from the intestine.   Poor nutrition.   Chronic kidney, thyroid, and liver disease.  Bone marrow disorders that decrease red blood cell production.  Cancer and treatments for cancer.  HIV, AIDS, and their treatments.  Spleen problems that increase red blood cell destruction.  Blood disorders.  Excess destruction of red blood cells due to infection, medicines, and autoimmune disorders. SIGNS AND SYMPTOMS   Minor weakness.   Dizziness.   Headache.  Palpitations.   Shortness of breath, especially with exercise.   Paleness.  Cold sensitivity.  Indigestion.  Nausea.  Difficulty sleeping.  Difficulty concentrating. Symptoms may occur suddenly or they may develop slowly.  DIAGNOSIS  Additional blood tests are often needed. These help your health care provider determine the best treatment. Your health care provider will check your stool for blood and look for other causes of blood loss.  TREATMENT  Treatment varies depending on the cause of the anemia. Treatment can include:   Supplements of iron, vitamin B12, or folic acid.   Hormone medicines.   A blood transfusion. This may be needed if blood loss is severe.   Hospitalization. This may be needed if there is significant continual blood loss.   Dietary changes.  Spleen removal. HOME CARE INSTRUCTIONS Keep all follow-up appointments. It often takes many weeks to correct anemia, and having your health care provider check on your condition and your response to  treatment is very important. SEEK IMMEDIATE MEDICAL CARE IF:   You develop extreme weakness, shortness of breath, or chest pain.   You become dizzy or have trouble concentrating.  You develop heavy vaginal bleeding.   You develop a rash.   You have bloody or black, tarry stools.   You faint.   You vomit up blood.   You vomit repeatedly.   You have abdominal pain.  You have a fever or persistent symptoms for more than 2-3 days.   You have a fever and your symptoms suddenly get worse.   You are dehydrated.  MAKE SURE YOU:  Understand these instructions.  Will watch your condition.  Will get help right away if you are not doing well or get worse.   This information is not intended to replace advice given to you by your health care provider. Make sure you discuss any questions you have with your health care provider.   Document Released: 06/26/2004 Document Revised: 01/19/2013 Document Reviewed: 11/12/2012 Elsevier Interactive Patient Education 2016 Elsevier Inc. Stimulant Use Disorder-Cocaine Cocaine is one of a group of powerful drugs called stimulants. Cocaine has medical uses for stopping nosebleeds and for pain control before minor nose or dental surgery. However, cocaine is misused because of the effects that it produces. These effects include:   A feeling of extreme pleasure.  Alertness.  High energy. Common street names for cocaine include coke, crack, blow, snow, and nose candy. Cocaine is snorted, dissolved in water and injected, or smoked.  Stimulants are addictive because they activate regions of the brain that produce both the pleasurable sensation of "reward" and psychological dependence. Together, these  actions account for loss of control and the rapid development of drug dependence. This means you become ill without the drug (withdrawal) and need to keep using it to function.  Stimulant use disorder is use of stimulants that disrupts your daily  life. It disrupts relationships with family and friends and how you do your job. Cocaine increases your blood pressure and heart rate. It can cause a heart attack or stroke. Cocaine can also cause death from irregular heart rate or seizures. SYMPTOMS Symptoms of stimulant use disorder with cocaine include:  Use of cocaine in larger amounts or over a longer period of time than intended.  Unsuccessful attempts to cut down or control cocaine use.  A lot of time spent obtaining, using, or recovering from the effects of cocaine.  A strong desire or urge to use cocaine (craving).  Continued use of cocaine in spite of major problems at work, school, or home because of use.  Continued use of cocaine in spite of relationship problems because of use.  Giving up or cutting down on important life activities because of cocaine use.  Use of cocaine over and over in situations when it is physically hazardous, such as driving a car.  Continued use of cocaine in spite of a physical problem that is likely related to use. Physical problems can include:  Malnutrition.  Nosebleeds.  Chest pain.  High blood pressure.  A hole that develops between the part of your nose that separates your nostrils (perforated nasal septum).  Lung and kidney damage.  Continued use of cocaine in spite of a mental problem that is likely related to use. Mental problems can include:  Schizophrenia-like symptoms.  Depression.  Bipolar mood swings.  Anxiety.  Sleep problems.  Need to use more and more cocaine to get the same effect, or lessened effect over time with use of the same amount of cocaine (tolerance).  Having withdrawal symptoms when cocaine use is stopped, or using cocaine to reduce or avoid withdrawal symptoms. Withdrawal symptoms include:  Depressed or irritable mood.  Low energy or restlessness.  Bad dreams.  Poor or excessive sleep.  Increased appetite. DIAGNOSIS Stimulant use disorder  is diagnosed by your health care provider. You may be asked questions about your cocaine use and how it affects your life. A physical exam may be done. A drug screen may be ordered. You may be referred to a mental health professional. The diagnosis of stimulant use disorder requires at least two symptoms within 12 months. The type of stimulant use disorder depends on the number of signs and symptoms you have. The type may be:  Mild. Two or three signs and symptoms.  Moderate. Four or five signs and symptoms.  Severe. Six or more signs and symptoms. TREATMENT Treatment for stimulant use disorder is usually provided by mental health professionals with training in substance use disorders. The following options are available:  Counseling or talk therapy. Talk therapy addresses the reasons you use cocaine and ways to keep you from using again. Goals of talk therapy include:  Identifying and avoiding triggers for use.  Handling cravings.  Replacing use with healthy activities.  Support groups. Support groups provide emotional support, advice, and guidance.  Medicine. Certain medicines may decrease cocaine cravings or withdrawal symptoms. HOME CARE INSTRUCTIONS  Take medicines only as directed by your health care provider.  Identify the people and activities that trigger your cocaine use and avoid them.  Keep all follow-up visits as directed by your health care  provider. SEEK MEDICAL CARE IF:  Your symptoms get worse or you relapse.  You are not able to take medicines as directed. SEEK IMMEDIATE MEDICAL CARE IF:  You have serious thoughts about hurting yourself or others.  You have a seizure, chest pain, sudden weakness, or loss of speech or vision. FOR MORE INFORMATION  National Institute on Drug Abuse: http://www.price-smith.com/  Substance Abuse and Mental Health Services Administration: SkateOasis.com.pt   This information is not intended to replace advice given to you by your health care  provider. Make sure you discuss any questions you have with your health care provider.   Document Released: 05/16/2000 Document Revised: 06/09/2014 Document Reviewed: 06/01/2013 Elsevier Interactive Patient Education 2016 [employer]. Cannabis Use Disorder Cannabis use disorder is a mental disorder. It is not one-time or occasional use of cannabis, more commonly known as marijuana. Cannabis use disorder is the continued, nonmedical use of cannabis that interferes with normal life activities or causes health problems. People with cannabis use disorder get a feeling of extreme pleasure and relaxation from cannabis use. This "high" is very rewarding and causes people to use over and over.  The mind-altering ingredient in cannabis is know as THC. THC can also interfere with motor coordination, memory, judgment, and accurate sense of space and time. These effects can last for a few days after using cannabis. Regular heavy cannabis use can cause long-lasting problems with thinking and learning. In young people, these problems may be permanent. Cannabis sometimes causes severe anxiety, paranoia, or visual hallucinations. Man-made (synthetic) cannabis-like drugs, such as "spice" and "K2," cause the same effects as THC but are much stronger. Cannabis-like drugs can cause dangerously high blood pressure and heart rate.  Cannabis use disorder usually starts in the teenage years. It can trigger the development of schizophrenia. It is somewhat more common in men than women. People who have family members with the disorder or existing mental health issues such as depression and posttraumatic stress disorderare more likely to develop cannabis use disorder. People with cannabis use disorder are at higher risk for use of other drugs of abuse.  SIGNS AND SYMPTOMS Signs and symptoms of cannabis use disorder include:   Use of cannabis in larger amounts or over a longer period than intended.   Unsuccessful attempts to  cut down or control cannabis use.   A lot of time spent obtaining, using, or recovering from the effects of cannabis.   A strong desire or urge to use cannabis (cravings).   Continued use of cannabis in spite of problems at work, school, or home because of use.   Continued use of cannabis in spite of relationship problems because of use.  Giving up or cutting down on important life activities because of cannabis use.  Use of cannabis over and over even in situations when it is physically hazardous, such as when driving a car.   Continued use of cannabis in spite of a physical problem that is likely related to use. Physical problems can include:  Chronic cough.  Bronchitis.  Emphysema.  Throat and lung cancer.  Continued use of cannabis in spite of a mental problem that is likely related to use. Mental problems can include:  Psychosis.  Anxiety.  Difficulty sleeping.  Need to use more and more cannabis to get the same effect, or lessened effect over time with use of the same amount (tolerance).  Having withdrawal symptoms when cannabis use is stopped, or using cannabis to reduce or avoid withdrawal symptoms. Withdrawal symptoms  include:  Irritability or anger.  Anxiety or restlessness.  Difficulty sleeping.  Loss of appetite or weight.  Aches and pains.  Shakiness.  Sweating.  Chills. DIAGNOSIS Cannabis use disorder is diagnosed by your health care provider. You may be asked questions about your cannabis use and how it affects your life. A physical exam may be done. A drug screen may be done. You may be referred to a mental health professional. The diagnosis of cannabis use disorder requires at least two symptoms within 12 months. The type of cannabis use disorder you have depends on the number of symptoms you have. The type may be:  Mild. Two or three signs and symptoms.   Moderate. Four or five signs and symptoms.   Severe. Six or more signs and  symptoms.  TREATMENT Treatment is usually provided by mental health professionals with training in substance use disorders. The following options are available:  Counseling or talk therapy. Talk therapy addresses the reasons you use cannabis. It also addresses ways to keep you from using again. The goals of talk therapy include:  Identifying and avoiding triggers for use.  Learning how to handle cravings.  Replacing use with healthy activities.  Support groups. Support groups provide emotional support, advice, and guidance.  Medicine. Medicine is used to treat mental health issues that trigger cannabis use or that result from it. HOME CARE INSTRUCTIONS  Take medicines only as directed by your health care provider.  Check with your health care provider before starting any new medicines.  Keep all follow-up visits as directed by your health care provider. SEEK MEDICAL CARE IF:  You are not able to take your medicines as directed.  Your symptoms get worse. SEEK IMMEDIATE MEDICAL CARE IF: You have serious thoughts about hurting yourself or others. FOR MORE INFORMATION  National Institute on Drug Abuse: http://www.price-smith.com/  Substance Abuse and Mental Health Services Administration: SkateOasis.com.pt   This information is not intended to replace advice given to you by your health care provider. Make sure you discuss any questions you have with your health care provider.   Document Released: 05/16/2000 Document Revised: 06/09/2014 Document Reviewed: 06/01/2013 Elsevier Interactive Patient Education 2016 Elsevier Inc. Postpartum Care After Vaginal Delivery After you deliver your newborn (postpartum period), the usual stay in the hospital is 24-72 hours. If there were problems with your labor or delivery, or if you have other medical problems, you might be in the hospital longer.  While you are in the hospital, you will receive help and instructions on how to care for yourself and your  newborn during the postpartum period.  While you are in the hospital:  Be sure to tell your nurses if you have pain or discomfort, as well as where you feel the pain and what makes the pain worse.  If you had an incision made near your vagina (episiotomy) or if you had some tearing during delivery, the nurses may put ice packs on your episiotomy or tear. The ice packs may help to reduce the pain and swelling.  If you are breastfeeding, you may feel uncomfortable contractions of your uterus for a couple of weeks. This is normal. The contractions help your uterus get back to normal size.  It is normal to have some bleeding after delivery.  For the first 1-3 days after delivery, the flow is red and the amount may be similar to a period.  It is common for the flow to start and stop.  In the first few days,  you may pass some small clots. Let your nurses know if you begin to pass large clots or your flow increases.  Do not  flush blood clots down the toilet before having the nurse look at them.  During the next 3-10 days after delivery, your flow should become more watery and pink or brown-tinged in color.  Ten to fourteen days after delivery, your flow should be a small amount of yellowish-white discharge.  The amount of your flow will decrease over the first few weeks after delivery. Your flow may stop in 6-8 weeks. Most women have had their flow stop by 12 weeks after delivery.  You should change your sanitary pads frequently.  Wash your hands thoroughly with soap and water for at least 20 seconds after changing pads, using the toilet, or before holding or feeding your newborn.  You should feel like you need to empty your bladder within the first 6-8 hours after delivery.  In case you become weak, lightheaded, or faint, call your nurse before you get out of bed for the first time and before you take a shower for the first time.  Within the first few days after delivery, your breasts may  begin to feel tender and full. This is called engorgement. Breast tenderness usually goes away within 48-72 hours after engorgement occurs. You may also notice milk leaking from your breasts. If you are not breastfeeding, do not stimulate your breasts. Breast stimulation can make your breasts produce more milk.  Spending as much time as possible with your newborn is very important. During this time, you and your newborn can feel close and get to know each other. Having your newborn stay in your room (rooming in) will help to strengthen the bond with your newborn. It will give you time to get to know your newborn and become comfortable caring for your newborn.  Your hormones change after delivery. Sometimes the hormone changes can temporarily cause you to feel sad or tearful. These feelings should not last more than a few days. If these feelings last longer than that, you should talk to your caregiver.  If desired, talk to your caregiver about methods of family planning or contraception.  Talk to your caregiver about immunizations. Your caregiver may want you to have the following immunizations before leaving the hospital:  Tetanus, diphtheria, and pertussis (Tdap) or tetanus and diphtheria (Td) immunization. It is very important that you and your family (including grandparents) or others caring for your newborn are up-to-date with the Tdap or Td immunizations. The Tdap or Td immunization can help protect your newborn from getting ill.  Rubella immunization.  Varicella (chickenpox) immunization.  Influenza immunization. You should receive this annual immunization if you did not receive the immunization during your pregnancy.   This information is not intended to replace advice given to you by your health care provider. Make sure you discuss any questions you have with your health care provider.   Document Released: 03/16/2007 Document Revised: 02/11/2012 Document Reviewed: 01/14/2012 Elsevier  Interactive Patient Education [employer]

## 2016-02-21 NOTE — Care Management (Signed)
Received referral for drug abuse in pregnancy. Clinical Social Worker [place]  Seen [name] 02/20/16 Mariah GreetBrenda S Micheale Schlack RN MSN CCM Care Management [phone]

## 2016-02-21 NOTE — Discharge Summary (Signed)
Obstetric Discharge Summary   Patient ID: Mariah Fox MRN: [id number] DOB/AGE: 36/12/1980 34 y.o.   Date of Admission: 02/18/2016  Date of Discharge: 02/21/16  Admitting Diagnosis:IUP at 8181w1d  Secondary Diagnosis:Cocaine and MJ Abuse  Mode of Delivery:NSVD   Discharge Diagnosis: NSVD of viable infant in NICU for observation for cocaine and MJ effects   Intrapartum Procedures: See L&D   Post partum procedures:  Social Services (CSW consult)  Complications: none   Brief Hospital Course  Mariah ClinesLaketha M Delapena is a [id number] who had a SVD on PPD#2;  for further details of this delivery, please refer to the delivery note.  Patient had an uncomplicated postpartum course.  By time of discharge on PPD#2, her pain was controlled on oral pain medications; she had appropriate lochia and was ambulating, voiding without difficulty and tolerating regular diet.  She was deemed stable for discharge to home.     Labs: CBC Latest Ref Rng & Units 02/20/2016 02/18/2016 01/31/2016  WBC 3.6 - 11.0 K/uL 8.7 6.4 6.1  Hemoglobin 12.0 - 16.0 g/dL 11.9(L) 14.5 13.5  Hematocrit 35.0 - 47.0 % 33.9(L) 41.8 38.8  Platelets 150 - 440 K/uL 139(L) 165 152   A POS  Physical exam:  Blood pressure (!) 143/83, pulse 68, temperature 98.7 F (37.1 C), temperature source Oral, resp. rate 16, height 5' 4.5" (1.638 m), weight 145 lb 12.8 oz (66.1 kg), SpO2 100 %, unknown if currently breastfeeding. General: alert and no distress Heart: S1S2, RRR, No M/R/G Lungs: CTA bilat, no W/R/R. Lochia: appropriate, no clots Abdomen: soft, NT Uterine Fundus: firm, U-2 Extremities: No evidence of DVT seen on physical exam. No lower extremity edema.  Discharge Instructions: Per After Visit Summary. Activity: Advance as tolerated. Pelvic rest for 6 weeks.  Also refer to After Visit Summary Diet: Regular Medications: Ibuprofen OTC, PNV and FE    Medication List    TAKE these medications   prenatal multivitamin Tabs  tablet Take 1 tablet by mouth daily at 12 noon.      Outpatient follow up:  Postpartum contraception: Depo Provera 150 mg IM  Discharged Condition: stable   Discharged to: Home  Newborn Data: See NICU records  Baby Boy in NICU  Disposition: NICU for observation  Apgars: APGAR (1 MIN): 7   APGAR (5 MINS): 8   APGAR (10 MINS):    Baby Feeding: Bottle   Mariah Fox, CNM 02/21/2016

## 2016-02-21 NOTE — Progress Notes (Signed)
Called Mariah Fox at 1245 to ask about pt's RX for Crown HoldingsLabatelol.  No RX given to pt per Milon Scorearon Fox Pt to follow up with [facility] for BP check in 1 week

## 2016-02-21 NOTE — Clinical Social Work Note (Signed)
CSW contacted by DSS caseworker: Judeth CornfieldStephanie late yesterday and she will be by approximately 9:30 this morning to assess/interview patient. CSW has informed nurse and has informed patient. [place] SpanielMonica Ceara Wrightson MSW,LCSW [phone]

## 2016-02-21 NOTE — Progress Notes (Signed)
Post Partum Day 2 Subjective: "Not really wanting to go home" Baby in NICU for +cocaine, +MJ  Objective: Blood pressure (!) 143/83, pulse 68, temperature 98.7 F (37.1 C), temperature source Oral, resp. rate 16, height 5' 4.5" (1.638 m), weight 145 lb 12.8 oz (66.1 kg), SpO2 100 %, unknown if currently breastfeeding.  Physical Exam:  General: a,A&O x3 Heart: S1S2, RRR, No M/R/G. Lungs: CTA bilat, no W/R/R. Lochia: Mod, no clots Uterine Fundus: U_1 DVT Evaluation:Neg   Recent Labs  02/18/16 1329 02/20/16 0549  HGB 14.5 11.9*  HCT 41.8 33.9*  WBC 6.4 8.7  PLT 165 139*    Assessment/Plan: A: PPD#2 stable 2. Addiction abuse of Cocaine and MJ P: social services is involved 2. Depo Provera 150 mg IM prior to dc home  LOS: 3 days   Sharee PimpleCaron W Madoline Bhatt 02/21/2016, 9:12 AM

## 2016-02-21 NOTE — Progress Notes (Signed)
Discharge instructions reviewed with patient.  All questions answered.

## 2016-02-22 LAB — SURGICAL PATHOLOGY

## 2016-05-24 ENCOUNTER — Encounter: Payer: Self-pay | Admitting: Emergency Medicine

## 2016-05-24 ENCOUNTER — Emergency Department
Admission: EM | Admit: 2016-05-24 | Discharge: 2016-05-24 | Disposition: A | Discharge status: Home / Self Care | Payer: Medicaid Other | Attending: Emergency Medicine | Admitting: Emergency Medicine

## 2016-05-24 DIAGNOSIS — X58XXXA Exposure to other specified factors, initial encounter: Secondary | ICD-10-CM | POA: Diagnosis not present

## 2016-05-24 DIAGNOSIS — Z79899 Other long term (current) drug therapy: Secondary | ICD-10-CM | POA: Diagnosis not present

## 2016-05-24 DIAGNOSIS — Z87891 Personal history of nicotine dependence: Secondary | ICD-10-CM | POA: Diagnosis not present

## 2016-05-24 DIAGNOSIS — Y999 Unspecified external cause status: Secondary | ICD-10-CM | POA: Insufficient documentation

## 2016-05-24 DIAGNOSIS — Y929 Unspecified place or not applicable: Secondary | ICD-10-CM | POA: Diagnosis not present

## 2016-05-24 DIAGNOSIS — S39012A Strain of muscle, fascia and tendon of lower back, initial encounter: Secondary | ICD-10-CM | POA: Diagnosis not present

## 2016-05-24 DIAGNOSIS — M62830 Muscle spasm of back: Secondary | ICD-10-CM

## 2016-05-24 DIAGNOSIS — Y939 Activity, unspecified: Secondary | ICD-10-CM | POA: Diagnosis not present

## 2016-05-24 DIAGNOSIS — S3992XA Unspecified injury of lower back, initial encounter: Secondary | ICD-10-CM | POA: Diagnosis present

## 2016-05-24 MED ORDER — CYCLOBENZAPRINE HCL 10 MG PO TABS: 10.0000 mg | ORAL_TABLET | Freq: Three times a day (TID) | ORAL | 0 refills | Status: DC | PRN

## 2016-05-24 MED ORDER — TRAMADOL HCL 50 MG PO TABS: 50.0000 mg | ORAL_TABLET | Freq: Four times a day (QID) | ORAL | 0 refills | Status: AC | PRN

## 2016-05-24 MED ORDER — KETOROLAC TROMETHAMINE 60 MG/2ML IM SOLN
60.0000 mg | Freq: Once | INTRAMUSCULAR | Status: AC
  Administered 2016-05-24: 60 mg via INTRAMUSCULAR
  Filled 2016-05-24: qty 2

## 2016-05-24 MED ORDER — IBUPROFEN 600 MG PO TABS: 600.0000 mg | ORAL_TABLET | Freq: Three times a day (TID) | ORAL | 0 refills | Status: AC | PRN

## 2016-05-24 MED ORDER — ORPHENADRINE CITRATE 30 MG/ML IJ SOLN
60.0000 mg | Freq: Two times a day (BID) | INTRAMUSCULAR | Status: DC
  Administered 2016-05-24: 60 mg via INTRAMUSCULAR
  Filled 2016-05-24: qty 2

## 2016-05-24 NOTE — ED Provider Notes (Signed)
[facility] Emergency Department Provider Note   ____________________________________________   None    (approximate)  I have reviewed the triage vital signs and the nursing notes.   HISTORY  Chief Complaint Back Pain    HPI Mariah Fox is a 35 y.o. female patient presented with mid left back pain for 4-5 days. Patient denies any provocative incident to account for this pain. Patient state that she was safe way mostly of right lateral movements. The pain increases on the left side. Patient denies any radicular component to this pain. Patient denies any urinary complaints. Patient denies any fever. Patient denies any vaginal discharge. Patient rated the pain as a 5/10. Patient stated pain increases to a 10 over 10 with right lateral movements and flexion. No palliative measures taken for this complaint.   Past Medical History:  Diagnosis Date  . Medical history non-contributory     Patient Active Problem List   Diagnosis Date Noted  . Pregnancy affected by fetal growth restriction 02/18/2016  . Gestational hypertension 01/31/2016  . Low lying placenta nos or without hemorrhage, second trimester 10/04/2015  . Elderly multigravida in second trimester 10/04/2015    Past Surgical History:  Procedure Laterality Date  . NO PAST SURGERIES      Prior to Admission medications   Medication Sig Start Date End Date Taking? Authorizing Provider  cyclobenzaprine (FLEXERIL) 10 MG tablet Take 1 tablet (10 mg total) by mouth 3 (three) times daily as needed. 05/24/16   Joni Reiningonald K Jiovanny Burdell, PA-C  ibuprofen (ADVIL,MOTRIN) 600 MG tablet Take 1 tablet (600 mg total) by mouth every 8 (eight) hours as needed. 05/24/16   Joni Reiningonald K Nazareth Kirk, PA-C  Prenatal Vit-Fe Fumarate-FA (PRENATAL MULTIVITAMIN) TABS tablet Take 1 tablet by mouth daily at 12 noon.    Historical Provider, MD  traMADol (ULTRAM) 50 MG tablet Take 1 tablet (50 mg total) by mouth every 6 (six) hours as needed.  05/24/16 05/24/17  Joni Reiningonald K Guthrie Lemme, PA-C    Allergies Patient has no known allergies.  No family history on file.  Social History Social History  Substance Use Topics  . Smoking status: Former Smoker    Packs/day: 0.50    Types: Cigarettes    Quit date: 07/23/2015  . Smokeless tobacco: Never Used  . Alcohol use Yes     Comment: last use 1 week ago    Review of Systems Constitutional: No fever/chills Eyes: No visual changes. ENT: No sore throat. Cardiovascular: Denies chest pain. Respiratory: Denies shortness of breath. Gastrointestinal: No abdominal pain.  No nausea, no vomiting.  No diarrhea.  No constipation. Genitourinary: Negative for dysuria. Musculoskeletal: Left lateral back pain Skin: Negative for rash. Neurological: Negative for headaches, focal weakness or numbness.    ____________________________________________   PHYSICAL EXAM:  VITAL SIGNS: ED Triage Vitals  Enc Vitals Group     BP 05/24/16 1158 139/73     Pulse Rate 05/24/16 1158 100     Resp 05/24/16 1158 20     Temp 05/24/16 1158 97.5 F (36.4 C)     Temp Source 05/24/16 1158 Oral     SpO2 05/24/16 1158 97 %     Weight 05/24/16 1158 140 lb (63.5 kg)     Height 05/24/16 1158 5\' 5"  (1.651 m)     Head Circumference --      Peak Flow --      Pain Score 05/24/16 1159 5     Pain Loc --  Pain Edu? --      Excl. in GC? --     Constitutional: Alert and oriented. Well appearing and in no acute distress. Eyes: Conjunctivae are normal. PERRL. EOMI. Head: Atraumatic. Nose: No congestion/rhinnorhea. Mouth/Throat: Mucous membranes are moist.  Oropharynx non-erythematous. Neck: No stridor.  No cervical spine tenderness to palpation. Hematological/Lymphatic/Immunilogical: No cervical lymphadenopathy. Cardiovascular: Normal rate, regular rhythm. Grossly normal heart sounds.  Good peripheral circulation. Respiratory: Normal respiratory effort.  No retractions. Lungs CTAB. Gastrointestinal: Soft and  nontender. No distention. No abdominal bruits. No CVA tenderness. Musculoskeletal:No obvious spinal deformity. Patient has no guarding with palpation spinal processes. Patient has no CVA guarding. Patient has some moderate guarding with the left paraspinal muscle group. Patient has increased paraspinal muscle spasms on the left side with right lateral movements. Patient straight leg test. He waspatient states since then her because of the upper extremity. Neurologic:  Normal speech and language. No gross focal neurologic deficits are appreciated. No gait instability. Skin:  Skin is warm, dry and intact. No rash noted. Psychiatric: Mood and affect are normal. Speech and behavior are normal.  ____________________________________________   LABS (all labs ordered are listed, but only abnormal results are displayed)  Labs Reviewed - No data to display ____________________________________________  EKG   ____________________________________________  RADIOLOGY   ____________________________________________   PROCEDURES  Procedure(s) performed: None  Procedures  Critical Care performed: No  ____________________________________________   INITIAL IMPRESSION / ASSESSMENT AND PLAN / ED COURSE  Pertinent labs & imaging results that were available during my care of the patient were reviewed by me and considered in my medical decision making (see chart for details).  Left lumbar strain. Patient given discharge care instructions. Patient given a prescription for tramadol, Flexeril, and ibuprofen. Patient advised follow-up family doctor condition persists.  Clinical Course      ____________________________________________   FINAL CLINICAL IMPRESSION(S) / ED DIAGNOSES  Final diagnoses:  Muscle spasm of back      NEW MEDICATIONS STARTED DURING THIS VISIT:  New Prescriptions   CYCLOBENZAPRINE (FLEXERIL) 10 MG TABLET    Take 1 tablet (10 mg total) by mouth 3 (three) times daily  as needed.   IBUPROFEN (ADVIL,MOTRIN) 600 MG TABLET    Take 1 tablet (600 mg total) by mouth every 8 (eight) hours as needed.   TRAMADOL (ULTRAM) 50 MG TABLET    Take 1 tablet (50 mg total) by mouth every 6 (six) hours as needed.     Note:  This document was prepared using Dragon voice recognition software and may include unintentional dictation errors.    Joni Reiningonald K Jeronimo Hellberg, PA-C 05/24/16 1305    Governor Rooksebecca Lord, MD 05/24/16 [phone]

## 2016-05-24 NOTE — ED Triage Notes (Signed)
Mid back pain, states 4 - 5 days. States grabs her when she moves a certain way.

## 2016-05-24 NOTE — ED Notes (Signed)
NAD noted at time of D/C. Pt denies questions or concerns. Pt ambulatory to the lobby at this time.  

## 2016-05-24 NOTE — ED Notes (Signed)
Pt c/o upper back pain x 4-5 days. Pt states pain is in her muscles. Pain worse with movement. Pt maintains full ROM and sensation in all 4 extremities.

## 2016-06-26 ENCOUNTER — Emergency Department
Admission: EM | Admit: 2016-06-26 | Discharge: 2016-06-26 | Disposition: A | Discharge status: Home / Self Care | Payer: Medicaid Other | Attending: Emergency Medicine | Admitting: Emergency Medicine

## 2016-06-26 ENCOUNTER — Encounter: Payer: Self-pay | Admitting: *Deleted

## 2016-06-26 DIAGNOSIS — N3001 Acute cystitis with hematuria: Secondary | ICD-10-CM | POA: Insufficient documentation

## 2016-06-26 DIAGNOSIS — Z87891 Personal history of nicotine dependence: Secondary | ICD-10-CM | POA: Insufficient documentation

## 2016-06-26 DIAGNOSIS — N3000 Acute cystitis without hematuria: Secondary | ICD-10-CM

## 2016-06-26 DIAGNOSIS — M545 Low back pain: Secondary | ICD-10-CM | POA: Diagnosis present

## 2016-06-26 LAB — URINALYSIS, ROUTINE W REFLEX MICROSCOPIC
Bilirubin Urine: NEGATIVE
GLUCOSE, UA: NEGATIVE mg/dL
KETONES UR: NEGATIVE mg/dL
NITRITE: NEGATIVE
PROTEIN: 30 mg/dL — AB
Specific Gravity, Urine: 1.011 (ref 1.005–1.030)
pH: 6 (ref 5.0–8.0)

## 2016-06-26 MED ORDER — PHENAZOPYRIDINE HCL 200 MG PO TABS: 200.0000 mg | ORAL_TABLET | Freq: Three times a day (TID) | ORAL | 0 refills | Status: DC | PRN

## 2016-06-26 MED ORDER — NITROFURANTOIN MONOHYD MACRO 100 MG PO CAPS: 100.0000 mg | ORAL_CAPSULE | Freq: Two times a day (BID) | ORAL | 0 refills | Status: DC

## 2016-06-26 MED ORDER — NITROFURANTOIN MONOHYD MACRO 100 MG PO CAPS
100.0000 mg | ORAL_CAPSULE | Freq: Once | ORAL | Status: AC
  Administered 2016-06-26: 100 mg via ORAL
  Filled 2016-06-26: qty 1

## 2016-06-26 NOTE — ED Triage Notes (Signed)
States lower back pain for 2 days that radiates down to her tailbone

## 2016-06-26 NOTE — ED Notes (Signed)
See triage note  Lower back pain w/o injury  Pain moves into tailbone

## 2016-06-26 NOTE — Discharge Instructions (Signed)
Take the prescription meds as directed. Increase fluid intake and empty your bladder regularly. Follow-up with your provider for retest after antibiotics are completed. Return to the ED for worsening symptoms.

## 2016-06-26 NOTE — ED Provider Notes (Signed)
[facility] Emergency Department Provider Note ____________________________________________  Time seen: 1042  I have reviewed the triage vital signs and the nursing notes.  HISTORY  Chief Complaint  Back Pain  HPI Mariah Fox is a 36 y.o. female presents to the ED for evaluation of low back pain for the last 2 days. The patient describes left-sided low back pain that radiates to her tailbone and left lower abdomen. He does admit to having some scant hematuria last week with urination. She denies any interim nausea, vomiting, or diarrhea. Patient is 16 weeks postpartum from a normal vaginal delivery. She denies any abnormal vaginal bleeding. She is on Depo-Provera for birth control. She denies any difficulty with urination or defecation. She denies any incontinence of bladder or bowel, distal paresthesias, foot drop, leg weakness. She also denies any recent injury, accident, trauma, or fall.  Past Medical History:  Diagnosis Date  . Medical history non-contributory     Patient Active Problem List   Diagnosis Date Noted  . Pregnancy affected by fetal growth restriction 02/18/2016  . Gestational hypertension 01/31/2016  . Low lying placenta nos or without hemorrhage, second trimester 10/04/2015  . Elderly multigravida in second trimester 10/04/2015    Past Surgical History:  Procedure Laterality Date  . NO PAST SURGERIES      Prior to Admission medications   Medication Sig Start Date End Date Taking? Authorizing Provider  cyclobenzaprine (FLEXERIL) 10 MG tablet Take 1 tablet (10 mg total) by mouth 3 (three) times daily as needed. 05/24/16   Joni Reiningonald K Smith, PA-C  ibuprofen (ADVIL,MOTRIN) 600 MG tablet Take 1 tablet (600 mg total) by mouth every 8 (eight) hours as needed. 05/24/16   Joni Reiningonald K Smith, PA-C  nitrofurantoin, macrocrystal-monohydrate, (MACROBID) 100 MG capsule Take 1 capsule (100 mg total) by mouth 2 (two) times daily. 06/26/16   Miki Blank V Bacon  Gaelen Brager, PA-C  phenazopyridine (PYRIDIUM) 200 MG tablet Take 1 tablet (200 mg total) by mouth 3 (three) times daily as needed for pain. 06/26/16   Lee-Ann Gal V Bacon Rhyan Radler, PA-C  Prenatal Vit-Fe Fumarate-FA (PRENATAL MULTIVITAMIN) TABS tablet Take 1 tablet by mouth daily at 12 noon.    Historical Provider, MD  traMADol (ULTRAM) 50 MG tablet Take 1 tablet (50 mg total) by mouth every 6 (six) hours as needed. 05/24/16 05/24/17  Joni Reiningonald K Smith, PA-C    Allergies Patient has no known allergies.  History reviewed. No pertinent family history.  Social History Social History  Substance Use Topics  . Smoking status: Former Smoker    Packs/day: 0.50    Types: Cigarettes    Quit date: 07/23/2015  . Smokeless tobacco: Never Used  . Alcohol use Yes     Comment: last use 1 week ago    Review of Systems  Constitutional: Negative for fever. Cardiovascular: Negative for chest pain. Respiratory: Negative for shortness of breath. Gastrointestinal: Negative for abdominal pain, vomiting and diarrhea. Genitourinary: Negative for dysuria. Reports frequency and recent hematuria. Musculoskeletal: Positive for back pain. Skin: Negative for rash. Neurological: Negative for headaches, focal weakness or numbness. ____________________________________________  PHYSICAL EXAM:  VITAL SIGNS: ED Triage Vitals [06/26/16 1017]  Enc Vitals Group     BP      Pulse      Resp      Temp      Temp src      SpO2      Weight 148 lb (67.1 kg)     Height 5\' 4"  (1.626 m)  Head Circumference      Peak Flow      Pain Score 10     Pain Loc      Pain Edu?      Excl. in GC?     Constitutional: Alert and oriented. Well appearing and in no distress. Head: Normocephalic and atraumatic. Cardiovascular: Normal rate, regular rhythm. Normal distal pulses. Respiratory: Normal respiratory effort. No wheezes/rales/rhonchi. Gastrointestinal: Soft and nontender. No distentionOr rebound, guarding, or organomegaly. Patient  with left-sided flank pain tenderness. Musculoskeletal: Spinal alignment without midline tenderness, spasm, deformity, or step-off. Nontender with normal range of motion in all extremities.  Neurologic:  Normal gait without ataxia. Normal speech and language. No gross focal neurologic deficits are appreciated. Skin:  Skin is warm, dry and intact. No rash noted. Psychiatric: Mood and affect are normal. Patient exhibits appropriate insight and judgment. ____________________________________________   LABS (pertinent positives/negatives) Labs Reviewed  URINALYSIS, ROUTINE W REFLEX MICROSCOPIC - Abnormal; Notable for the following:       Result Value   Color, Urine YELLOW (*)    APPearance HAZY (*)    Hgb urine dipstick SMALL (*)    Protein, ur 30 (*)    Leukocytes, UA LARGE (*)    Bacteria, UA FEW (*)    Squamous Epithelial / LPF 0-5 (*)    All other components within normal limits  URINE CULTURE  ____________________________________________  PROCEDURES  Macrobid 100 mg PO ____________________________________________  INITIAL IMPRESSION / ASSESSMENT AND PLAN / ED COURSE  Patient with a clinical presentation consistent with an acute cystitis. Her exam shows left-sided flank pain and left lower abdominal pain. She's been afebrile without a nausea, vomiting, or diarrhea. Patient will dose Macrobid and phenazopyridine as directed. She can also continue to dose her previously prescribed tramadol for breakthrough pain. She will follow with the primary care provider at [facility] clinic for test of cure after antibodies are completed. Return precautions were reviewed.A urine culture is pending at the time of discharge. ____________________________________________  FINAL CLINICAL IMPRESSION(S) / ED DIAGNOSES  Final diagnoses:  Acute cystitis without hematuria      Lissa Hoard, PA-C 06/26/16 1235    Nita Sickle, MD 06/27/16 1943

## 2016-06-27 LAB — URINE CULTURE: Special Requests: NORMAL

## 2016-11-20 IMAGING — US US MFM OB FOLLOW-UP
1 series · 14 of 28 positions shown · non-contrast
Comparison: none

[Series 1: us mfm ob follow-up · 0.20mm/px · 14 of 52 slices shown]
[im 2/52]
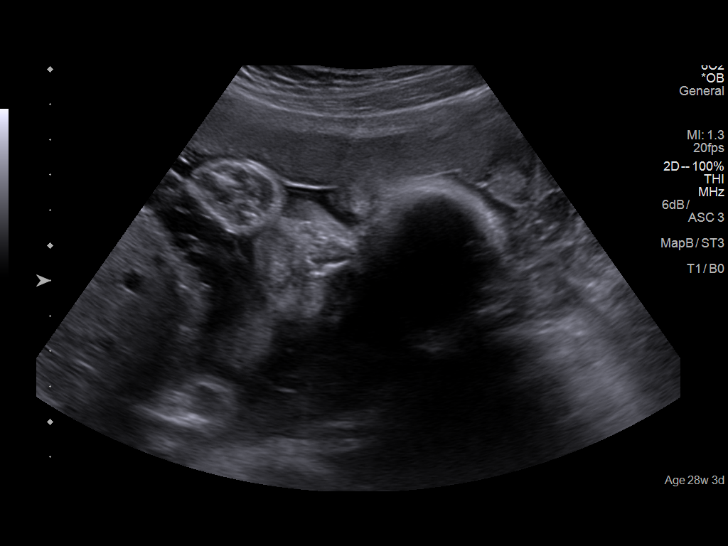
[im 6/52]
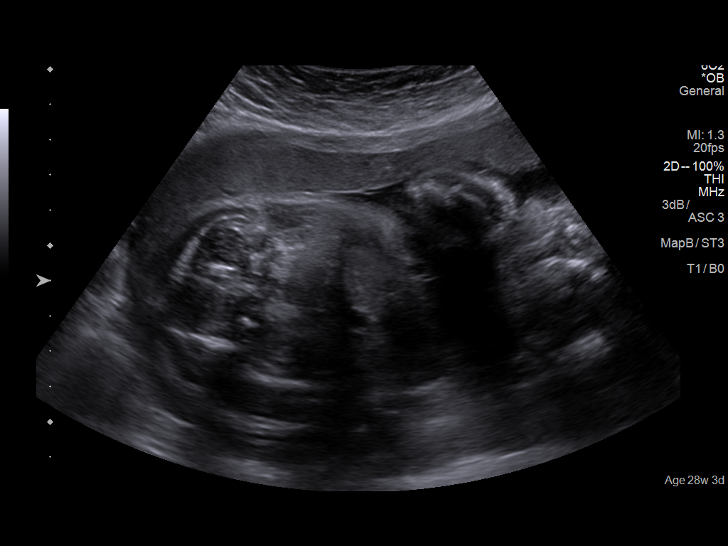
[im 10/52]
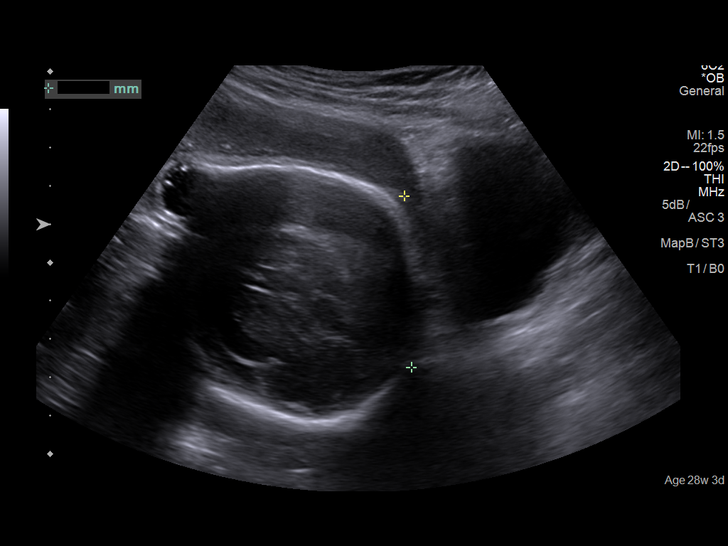
[im 14/52]
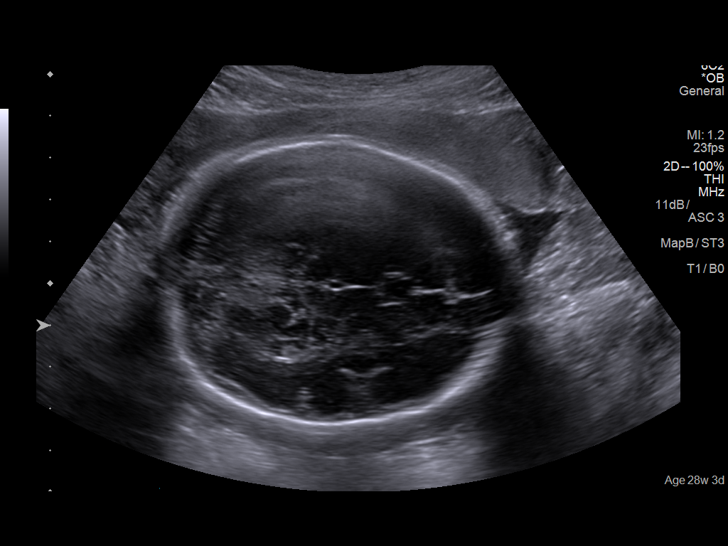
[im 18/52]
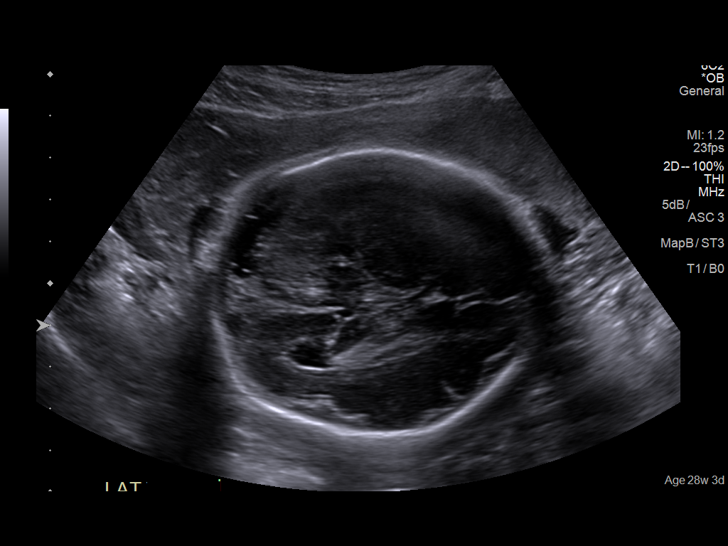
[im 21/52]
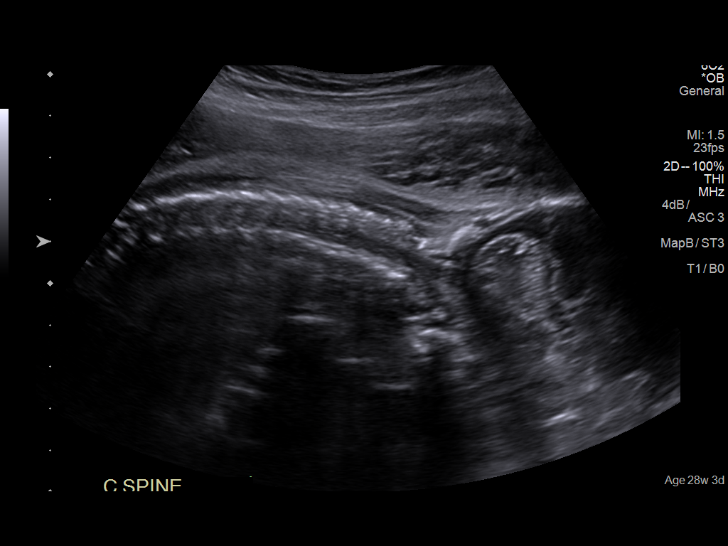
[im 25/52]
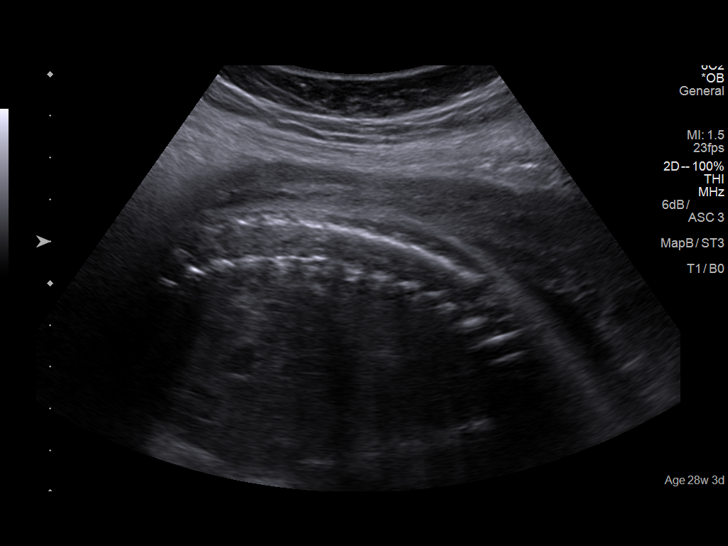
[im 29/52]
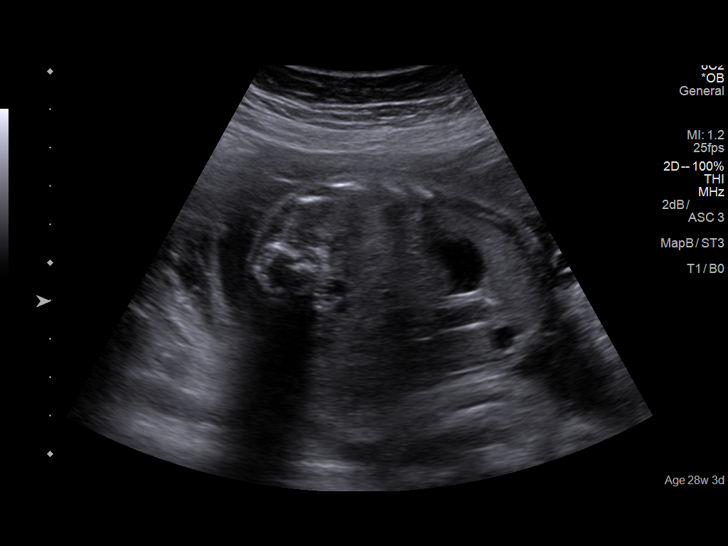
[im 33/52]
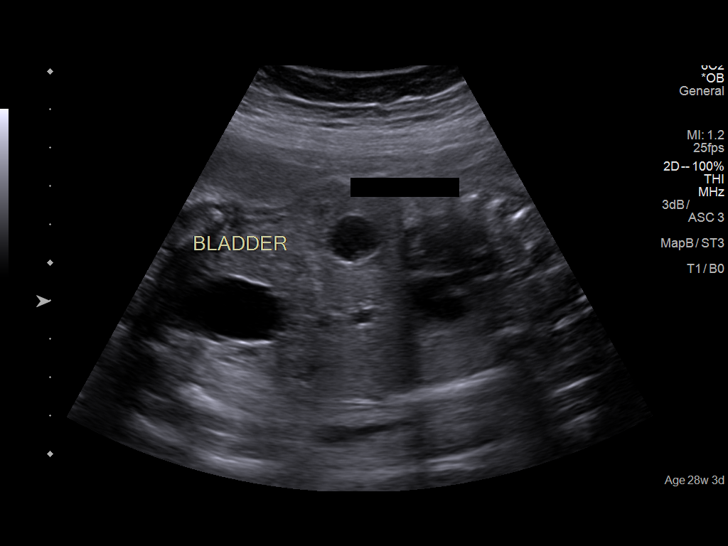
[im 36/52]
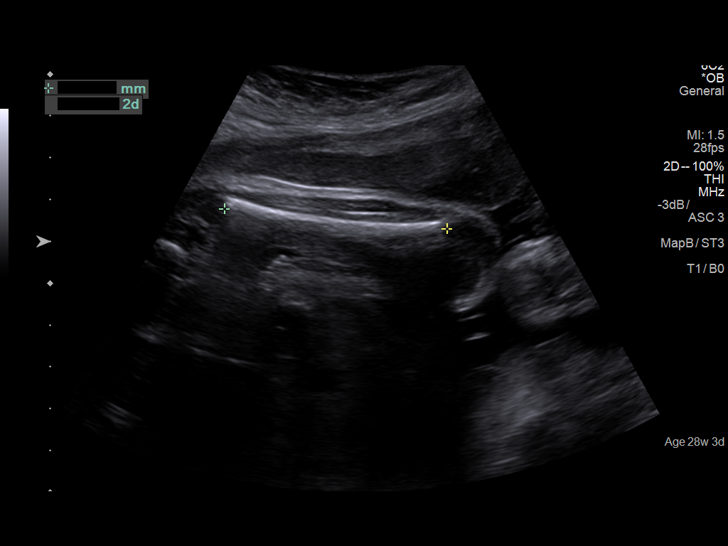
[im 40/52]
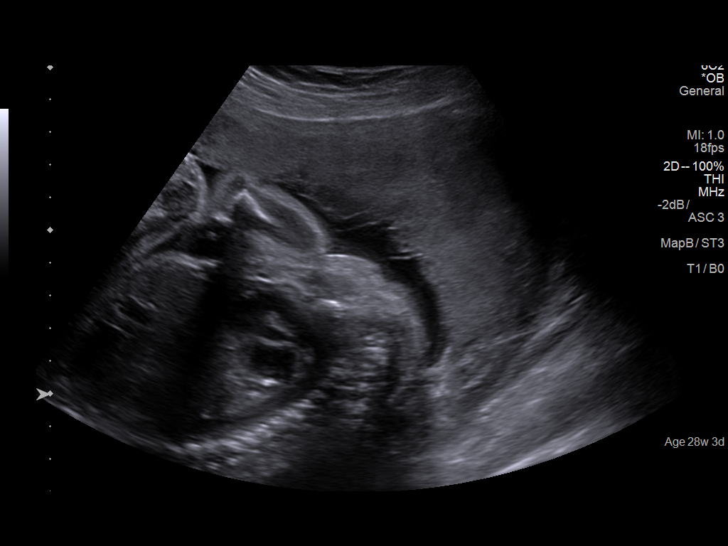
[im 44/52]
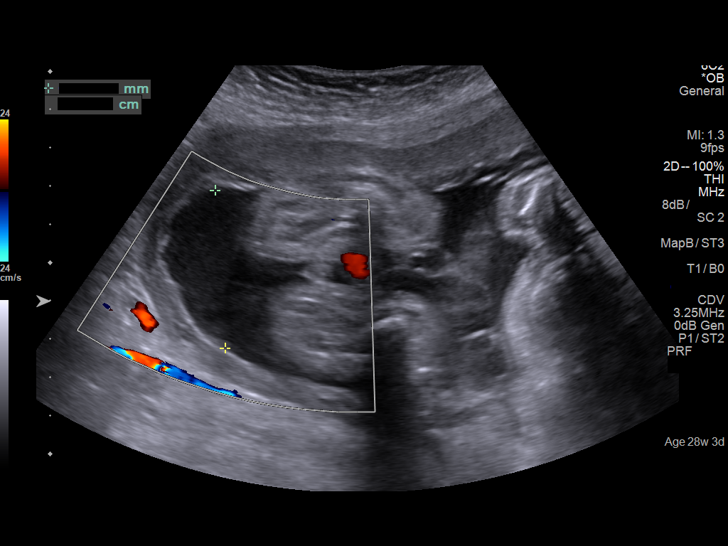
[im 48/52]
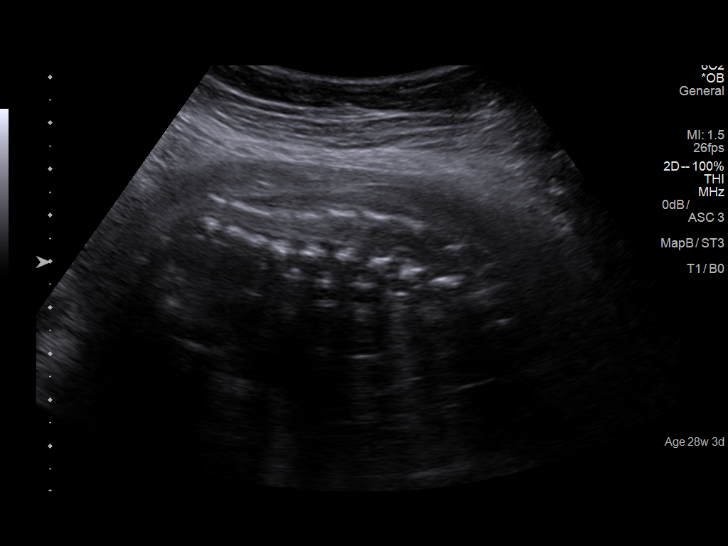
[im 52/52]
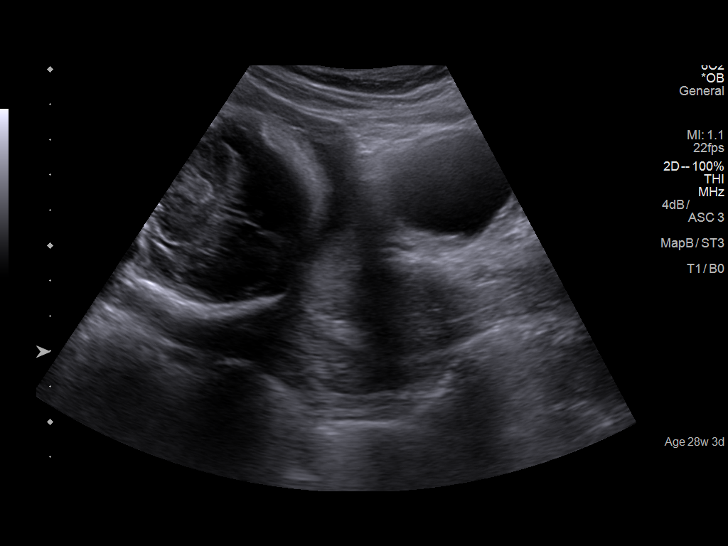

[14 of 28 positions shown; findings below may reference images not displayed]

Canned report from images found in remote index.

Refer to host system for actual result text.

## 2019-10-10 ENCOUNTER — Emergency Department: Payer: Self-pay

## 2019-10-10 ENCOUNTER — Encounter: Payer: Self-pay | Admitting: Emergency Medicine

## 2019-10-10 ENCOUNTER — Other Ambulatory Visit: Payer: Self-pay

## 2019-10-10 ENCOUNTER — Emergency Department
Admission: EM | Admit: 2019-10-10 | Discharge: 2019-10-10 | Disposition: A | Discharge status: Home / Self Care | Payer: Self-pay | Attending: Emergency Medicine | Admitting: Emergency Medicine

## 2019-10-10 DIAGNOSIS — F1410 Cocaine abuse, uncomplicated: Secondary | ICD-10-CM | POA: Insufficient documentation

## 2019-10-10 DIAGNOSIS — T7840XA Allergy, unspecified, initial encounter: Secondary | ICD-10-CM | POA: Insufficient documentation

## 2019-10-10 DIAGNOSIS — R079 Chest pain, unspecified: Secondary | ICD-10-CM

## 2019-10-10 DIAGNOSIS — Z79899 Other long term (current) drug therapy: Secondary | ICD-10-CM | POA: Insufficient documentation

## 2019-10-10 DIAGNOSIS — R0789 Other chest pain: Secondary | ICD-10-CM | POA: Insufficient documentation

## 2019-10-10 DIAGNOSIS — F1210 Cannabis abuse, uncomplicated: Secondary | ICD-10-CM | POA: Insufficient documentation

## 2019-10-10 DIAGNOSIS — I10 Essential (primary) hypertension: Secondary | ICD-10-CM | POA: Insufficient documentation

## 2019-10-10 DIAGNOSIS — Z87891 Personal history of nicotine dependence: Secondary | ICD-10-CM | POA: Insufficient documentation

## 2019-10-10 HISTORY — DX: Essential (primary) hypertension: I10

## 2019-10-10 LAB — CBC
HCT: 47.5 % — ABNORMAL HIGH (ref 36.0–46.0)
Hemoglobin: 16.9 g/dL — ABNORMAL HIGH (ref 12.0–15.0)
MCH: 35.4 pg — ABNORMAL HIGH (ref 26.0–34.0)
MCHC: 35.6 g/dL (ref 30.0–36.0)
MCV: 99.4 fL (ref 80.0–100.0)
Platelets: 267 10*3/uL (ref 150–400)
RBC: 4.78 MIL/uL (ref 3.87–5.11)
RDW: 11.9 % (ref 11.5–15.5)
WBC: 4.1 10*3/uL (ref 4.0–10.5)
nRBC: 0 % (ref 0.0–0.2)

## 2019-10-10 LAB — BASIC METABOLIC PANEL
Anion gap: 13 (ref 5–15)
BUN: 7 mg/dL (ref 6–20)
CO2: 26 mmol/L (ref 22–32)
Calcium: 8.7 mg/dL — ABNORMAL LOW (ref 8.9–10.3)
Chloride: 94 mmol/L — ABNORMAL LOW (ref 98–111)
Creatinine, Ser: 0.75 mg/dL (ref 0.44–1.00)
GFR calc Af Amer: >60 mL/min (ref >60)
GFR calc non Af Amer: >60 mL/min (ref >60)
Glucose, Bld: 114 mg/dL — ABNORMAL HIGH (ref 70–99)
Potassium: 2.9 mmol/L — ABNORMAL LOW (ref 3.5–5.1)
Sodium: 133 mmol/L — ABNORMAL LOW (ref 135–145)

## 2019-10-10 LAB — TROPONIN I (HIGH SENSITIVITY): Troponin I (High Sensitivity): 3 ng/L (ref <18)

## 2019-10-10 MED ORDER — PREDNISONE 20 MG PO TABS: 40.0000 mg | ORAL_TABLET | Freq: Every day | ORAL | 0 refills | Status: DC

## 2019-10-10 MED ORDER — PREDNISONE 20 MG PO TABS: 40.0000 mg | ORAL_TABLET | Freq: Once | ORAL | Status: DC

## 2019-10-10 NOTE — ED Triage Notes (Signed)
Pt here for allergic reaction. Reports starting with rash to back and chest last night as well as mild swelling to upper lip. No dyspnea noted or tongue swelling. VSS. No systemic symptoms of allergic reaction.  Pt unsure of what it is to.   Would also like to be seen for intermittent SHOB and CP over last 1-2 months that is worse with exertion.  Unlabored currently.  No fever or cough.

## 2019-10-10 NOTE — ED Provider Notes (Signed)
[facility] Emergency Department Provider Note  Time seen: 7:34 PM  I have reviewed the triage vital signs and the nursing notes.   HISTORY  Chief Complaint Allergic Reaction and Shortness of Breath   HPI Mariah Fox is a 39 y.o. female with a past medical history of hypertension presents to the emergency department for possible allergic reaction.  According to the patient for the past 2 days she has been experiencing itching of her chest states upper lip feels tight with itching there as well.  Denies any new exposures denies any new foods close detergents, etc..  Patient does state intermittently she has been experiencing chest pain as well but states this has been ongoing for months at least per patient.  Denies any chest pain currently no shortness of breath.   Past Medical History:  Diagnosis Date  . Hypertension   . Medical history non-contributory     Patient Active Problem List   Diagnosis Date Noted  . Pregnancy affected by fetal growth restriction 02/18/2016  . Gestational hypertension 01/31/2016  . Low lying placenta nos or without hemorrhage, second trimester 10/04/2015  . Elderly multigravida in second trimester 10/04/2015    Past Surgical History:  Procedure Laterality Date  . NO PAST SURGERIES      Prior to Admission medications   Medication Sig Start Date End Date Taking? Authorizing Provider  cyclobenzaprine (FLEXERIL) 10 MG tablet Take 1 tablet (10 mg total) by mouth 3 (three) times daily as needed. 05/24/16   Joni Reining, PA-C  ibuprofen (ADVIL,MOTRIN) 600 MG tablet Take 1 tablet (600 mg total) by mouth every 8 (eight) hours as needed. 05/24/16   Joni Reining, PA-C  nitrofurantoin, macrocrystal-monohydrate, (MACROBID) 100 MG capsule Take 1 capsule (100 mg total) by mouth 2 (two) times daily. 06/26/16   Menshew, Charlesetta Ivory, PA-C  phenazopyridine (PYRIDIUM) 200 MG tablet Take 1 tablet (200 mg total) by mouth 3 (three) times  daily as needed for pain. 06/26/16   Menshew, Charlesetta Ivory, PA-C  Prenatal Vit-Fe Fumarate-FA (PRENATAL MULTIVITAMIN) TABS tablet Take 1 tablet by mouth daily at 12 noon.    [provider]    No Known Allergies  History reviewed. No pertinent family history.  Social History Social History   Tobacco Use  . Smoking status: Former Smoker    Packs/day: 0.50    Types: Cigarettes    Quit date: 07/23/2015    Years since quitting: 4.2  . Smokeless tobacco: Never Used  Substance Use Topics  . Alcohol use: Yes    Comment: last use 1 week ago  . Drug use: Yes    Types: Cocaine, Marijuana    Comment: last use 1 week ago, MJ and cocaine    Review of Systems Constitutional: Negative for fever Cardiovascular: Negative for chest pain. Respiratory: Negative for shortness of breath.  No wheeze. Gastrointestinal: Negative for abdominal pain, vomiting Musculoskeletal: Negative for musculoskeletal complaints Skin: States itching in the upper lip and chest. Neurological: Negative for headache All other ROS negative  ____________________________________________   PHYSICAL EXAM:  VITAL SIGNS: ED Triage Vitals  Enc Vitals Group     BP 10/10/19 1603 (!) 130/95     Pulse Rate 10/10/19 1603 99     Resp 10/10/19 1603 20     Temp 10/10/19 1603 98.4 F (36.9 C)     Temp Source 10/10/19 1603 Oral     SpO2 10/10/19 1603 97 %     Weight 10/10/19  1600 170 lb (77.1 kg)     Height 10/10/19 1600 5\' 5"  (1.651 m)     Head Circumference --      Peak Flow --      Pain Score 10/10/19 1600 0     Pain Loc --      Pain Edu? --      Excl. in [place]? --     Constitutional: Alert and oriented. Well appearing and in no distress. Eyes: Normal exam ENT      Head: Normocephalic and atraumatic.      Mouth/Throat: Mucous membranes are moist.  No objective swelling at this time. Cardiovascular: Normal rate, regular rhythm. No murmur Respiratory: Normal respiratory effort without tachypnea nor  retractions. Breath sounds are clear  Gastrointestinal: Soft and nontender. No distention.  Musculoskeletal: Nontender with normal range of motion in all extremities. Neurologic:  Normal speech and language. No gross focal neurologic deficits Skin:  Skin is warm, dry.  No obvious rash identified. Psychiatric: Mood and affect are normal  ____________________________________________    EKG  EKG viewed and interpreted by myself shows sinus tachycardia 104 bpm with a narrow QRS, normal axis, normal intervals, no concerning ST changes.  ____________________________________________    RADIOLOGY  X-ray is negative.  ____________________________________________   INITIAL IMPRESSION / ASSESSMENT AND PLAN / ED COURSE  Pertinent labs & imaging results that were available during my care of the patient were reviewed by me and considered in my medical decision making (see chart for details).   Patient presents to the emergency department for chest pain and allergic reaction/itching.  Chest pain is been intermittent times months or longer per patient.  Negative work-up including negative cardiac enzymes chest x-ray and reassuring EKG.  Patient also states itching to her lip chest and upper back.  No obvious hives identified.  Patient states her upper lip seems slightly swollen no objective swelling noted on my examination but this can be difficult to decipher.  Given the patient's complaints of itching and sensation of lip swelling we will start the patient on steroids for the next several days.  I discussed Benadryl 50 mg every 6 hours as needed.  I discussed my typical allergic reaction return precautions as well as chest pain return precautions.  Patient agreeable to plan of care.  Mariah Fox was evaluated in Emergency Department on 10/10/2019 for the symptoms described in the history of present illness. She was evaluated in the context of the global COVID-19 pandemic, which necessitated  consideration that the patient might be at risk for infection with the SARS-CoV-2 virus that causes COVID-19. Institutional protocols and algorithms that pertain to the evaluation of patients at risk for COVID-19 are in a state of rapid change based on information released by regulatory bodies including the CDC and federal and state organizations. These policies and algorithms were followed during the patient's care in the ED.  ____________________________________________   FINAL CLINICAL IMPRESSION(S) / ED DIAGNOSES  Allergic reaction Chest pain   Harvest Dark, MD 10/10/19 [phone]

## 2021-05-17 ENCOUNTER — Ambulatory Visit: Payer: Self-pay

## 2021-07-11 ENCOUNTER — Ambulatory Visit: Payer: Self-pay

## 2021-07-24 ENCOUNTER — Ambulatory Visit: Payer: Self-pay

## 2021-09-24 ENCOUNTER — Ambulatory Visit: Payer: Self-pay

## 2021-09-24 ENCOUNTER — Encounter: Payer: Self-pay | Admitting: Advanced Practice Midwife

## 2021-09-24 ENCOUNTER — Ambulatory Visit (LOCAL_COMMUNITY_HEALTH_CENTER): Payer: Self-pay | Admitting: Advanced Practice Midwife

## 2021-09-24 VITALS — BP 138/93 | Ht 65.0 in | Wt 143.2 lb

## 2021-09-24 DIAGNOSIS — Z3046 Encounter for surveillance of implantable subdermal contraceptive: Secondary | ICD-10-CM

## 2021-09-24 DIAGNOSIS — F17200 Nicotine dependence, unspecified, uncomplicated: Secondary | ICD-10-CM

## 2021-09-24 DIAGNOSIS — Z3009 Encounter for other general counseling and advice on contraception: Secondary | ICD-10-CM

## 2021-09-24 DIAGNOSIS — I10 Essential (primary) hypertension: Secondary | ICD-10-CM

## 2021-09-24 DIAGNOSIS — F1010 Alcohol abuse, uncomplicated: Secondary | ICD-10-CM

## 2021-09-24 DIAGNOSIS — F1290 Cannabis use, unspecified, uncomplicated: Secondary | ICD-10-CM

## 2021-09-24 DIAGNOSIS — F1910 Other psychoactive substance abuse, uncomplicated: Secondary | ICD-10-CM

## 2021-09-24 LAB — WET PREP FOR TRICH, YEAST, CLUE
Trichomonas Exam: NEGATIVE
Yeast Exam: NEGATIVE

## 2021-09-24 LAB — HEMOGLOBIN, FINGERSTICK: Hemoglobin: 13.7 g/dL (ref 11.1–15.9)

## 2021-09-24 MED ORDER — PRENATAL VITAMINS 28-0.8 MG PO TABS: 1.0000 | ORAL_TABLET | Freq: Every day | ORAL | 0 refills | Status: DC

## 2021-09-24 NOTE — Progress Notes (Signed)
Here today for a PE and Nexplanon removal. Last PE here was 04/08/2016. 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 Plans pregnancy within the next year. Declines bloodwork today. Tawny Hopping, RN ? ?

## 2021-09-24 NOTE — Progress Notes (Signed)
Hgb and wet mount results reviewed while in clinic. Per standing orders no treatment indicated. Tawny Hopping, RN ? ?

## 2021-09-24 NOTE — Progress Notes (Signed)
[facility] DEPARTMENT ?[facility] ?[address]- [employer] ?Main Number: [phone] ? ? ? ?Family Planning Visit- Initial Visit ? ?Subjective:  ?Mariah Fox is a 41 y.o. SBF smoker [id number] (21, 17, 5)  being seen today for an initial annual visit and to discuss reproductive life planning.  The patient is currently using Hormonal Implant for pregnancy prevention. Patient reports   does want a pregnancy in the next year.   ? ? report they are looking for a method that provides Other don't want any birth control; wants to conceive ? ?Patient has the following medical conditions has Elderly multigravida in second trimester; Gestational hypertension; Hypertension dx'd 41 yo and self dc'd meds; Smoker; Marijuana use; and Alcohol abuse daily on their problem list. ? ?Chief Complaint  ?Patient presents with  ? Gynecologic Exam  ? Procedure  ?  PE  ?Nexplanon removal  ? ? ?Patient reports here for Nexplanon removal to conceive. Nexplanon inserted 2021 at [facility] per pt. Last PE 04/08/2016. Last pap 2013 LGSIL HPV+. LMP 08/11/21. Last sex 09/22/21 without condom; with current partner x 2 mo; 1 partner in last 3 mo. Pt very clear she wants to conceive.  Discussed last pregnancy and all her high risk consequences of uncontrolled HTN with that pregnancy and her high risk for another high risk compicated pregnancy. Pt insists on Nexplanon removal and conception.   Preconceptual counseling done including smoking cessation, ETOH cessation, drug cessation, needs f/u with PCP for rectal bleeding and HTN, take prenatal vits daily beginning today. Never had a mammogram. Last dental exam in her 30's. Agrees to Hgb and HgbA1C today. Not working. Living with her 2 youngest kids. 138/93. Last cig yesterday. Last vaped 07/2021. Last cigar 2022. Last MJ 07/2021. Last ETOH yesterday and daily (3 beers). ? ?Patient denies h/a, scotoma ? ?Body mass index is 23.83 kg/m?. - Patient is eligible for diabetes  screening based on BMI and age >64?  yes ?HA1C ordered? yes ? ?Patient reports 1  partner/s in last year. Desires STI screening?  No - declines bloodwork ? ?Has patient been screened once for HCV in the past?  No ? No results found for: HCVAB ? ?Does the patient have current drug use (including MJ), have a partner with drug use, and/or has been incarcerated since last result? Yes  ?If yes-- Screen for HCV through [facility] ?  ?Does the patient meet criteria for HBV testing? Yes ? ?Criteria:  ?-Household, sexual or needle sharing contact with HBV ?-History of drug use ?-HIV positive ?-Those with known Hep C ? ? ?Health Maintenance Due  ?Topic Date Due  ? Hepatitis C Screening  Never done  ? PAP SMEAR-Modifier  Never done  ? ? ?Review of Systems  ?Constitutional:  Positive for weight loss (pt doesn't know how much weight she has lost, it is not intentional, sometimes she feels nausea, fatigue, with rectal bleeding onset 2022 and has been to [facility] & they gave her some pills; referred back to [facility]).  ?Gastrointestinal:  Positive for blood in stool (onset 2022 was q BM but then stops x 1 week and then sometimes just blood comes out, other times blood in toilet water or mixed in stool with clots red blood; referred back to [facility]).  ?All other systems reviewed and are negative. ? ?The following portions of the patient's history were reviewed and updated as appropriate: allergies, current medications, past family history, past medical history, past social  history, past surgical history and problem list. Problem list updated. ? ? ?See flowsheet for other program required questions. ? ?Objective:  ? ?Vitals:  ? 09/24/21 1411  ?BP: (!) 138/93  ?Weight: 143 lb 3.2 oz (65 kg)  ?Height: 5\' 5"  (1.651 m)  ? ? ?Physical Exam ?Constitutional:   ?   Appearance: Normal appearance. She is normal weight.  ?HENT:  ?   Head: Normocephalic and atraumatic.  ?   Mouth/Throat:  ?   Mouth: Mucous membranes are moist.  ?    Comments: Last dental exam 41 yo ?Eyes:  ?   General: Scleral icterus (red sclera) present.  ?   Conjunctiva/sclera: Conjunctivae normal.  ?Neck:  ?   Thyroid: No thyroid mass, thyromegaly or thyroid tenderness.  ?Cardiovascular:  ?   Rate and Rhythm: Normal rate and regular rhythm.  ?Pulmonary:  ?   Effort: Pulmonary effort is normal.  ?   Breath sounds: Normal breath sounds.  ?Chest:  ?Breasts: ?   Right: Normal.  ?   Left: Normal.  ?Abdominal:  ?   Palpations: Abdomen is soft.  ?   Comments: Soft without masses or tenderness, fair tone  ?Genitourinary: ?   General: Normal vulva.  ?   Exam position: Lithotomy position.  ?   Vagina: Vaginal discharge (white malodorous leukorrhea, ph<4.5) present.  ?   Cervix: Friability (friable to pap) present.  ?   Uterus: Normal.   ?   Adnexa: Right adnexa normal and left adnexa normal.  ?   Rectum: Normal.  ?Musculoskeletal:     ?   General: Normal range of motion.  ?   Cervical back: Normal range of motion and neck supple.  ?Skin: ?   General: Skin is warm and dry.  ?Neurological:  ?   Mental Status: She is alert.  ?Psychiatric:     ?   Mood and Affect: Mood normal.  ? ? ? ? ?Assessment and Plan:  ?Mariah Fox is a 41 y.o. female presenting to the [facility] Department for an initial annual wellness/contraceptive visit ? ?Contraception counseling: Reviewed options based on patient desire and reproductive life plan. Patient is interested in No Method - Other Reason. This was provided to the patient today.  if not why not clearly documented ? ?Risks, benefits, and typical effectiveness rates were reviewed.  Questions were answered.  Written information was also given to the patient to review.   ? ?The patient will follow up in  prn  for surveillance.  The patient was told to call with any further questions, or with any concerns about this method of contraception.  Emphasized use of condoms 100% of the time for STI prevention. ? ?Need for ECP was assessed..   Reviewed options and patient desired No method of ECP, declined all   ? ?1. Family planning ?Treat wet mount per standing orders ?Immunization nurse consult ?Please give pt prenatal vits ?Please give dental list to pt ?Please give BCCCP # to pt to schedule mammogram ?- WET PREP FOR TRICH, YEAST, CLUE ?- Hemoglobin, venipuncture ?- Chlamydia/Gonorrhea [facility] ?- IGP, Aptima HPV ?- Hgb A1c w/o eAG ? ?2. Encounter for surveillance of implantable subdermal contraceptive ?Wants Nexplanon removal today ? ?3. Nexplanon removal ?Nexplanon Removal ?Patient identified, informed consent performed, consent signed.   Appropriate time out taken. Nexplanon site identified.  Area prepped in usual sterile fashon. 3 ml of 1% lidocaine with Epinephrine was used to anesthetize the area at the distal  end of the implant and along implant site. A small stab incision was made right beside the implant on the distal portion.  The Nexplanon rod was grasped using straight hemostats/manual and removed without difficulty.  There was minimal blood loss. There were no complications.  Steri-strips were applied over the small incision.  A pressure bandage was applied to reduce any bruising.  The patient tolerated the procedure well and was given post procedure instructions.    ?Nexplanon:  ? ?Counseled patient to take OTC analgesic starting as soon as lidocaine starts to wear off and take regularly for at least 48 hr to decrease discomfort.  Specifically to take with food or milk to decrease stomach upset and for IB 600 mg (3 tablets) every 6 hrs; IB 800 mg (4 tablets) every 8 hrs; or Aleve 2 tablets every 12 hrs. ?  ? ?4. Hypertension, unspecified type ?Referred to PCP for HTN control ?138/93 today ? ?5. Smoker ?Counseled via 5 A's to stop ? ?6. Marijuana use ?Counseled to stop ? ?7. Alcohol abuse daily ?Declines ETOH cessation assistance ?Counseled to stop before conceiving ? ? ? ? ?Return if symptoms worsen or fail to improve. ? ?No future  appointments. ? ?Alberteen SpindleElizabeth A Ariadna Setter, CNM ? ?

## 2021-09-25 ENCOUNTER — Telehealth: Payer: Self-pay

## 2021-09-25 LAB — HGB A1C W/O EAG: Hgb A1c MFr Bld: 5.2 % (ref 4.8–5.6)

## 2021-09-25 NOTE — Telephone Encounter (Signed)
Telephone call to patient this morning regarding her HgbA1c results=5.2 per Arnetha Courser, CNM.  Patient also inquired about her iron level.  It was 13.7.  Hart Carwin, RN ? ?

## 2021-09-25 NOTE — Telephone Encounter (Signed)
-----   Message from Alberteen Spindle, CNM sent at 09/25/2021  8:16 AM EDT ----- ?HgbA1C=5.2 ?Please call or send pt postcard with normal HgbA1c result. Thanks ?

## 2021-10-02 ENCOUNTER — Encounter: Payer: Self-pay | Admitting: Advanced Practice Midwife

## 2021-10-02 DIAGNOSIS — R87619 Unspecified abnormal cytological findings in specimens from cervix uteri: Secondary | ICD-10-CM | POA: Insufficient documentation

## 2021-10-02 LAB — IGP, APTIMA HPV
HPV Aptima: NEGATIVE
PAP Smear Comment: 0

## 2022-05-29 ENCOUNTER — Other Ambulatory Visit: Payer: Self-pay

## 2022-05-29 ENCOUNTER — Emergency Department: Payer: Medicaid Other

## 2022-05-29 ENCOUNTER — Encounter: Payer: Self-pay | Admitting: Emergency Medicine

## 2022-05-29 ENCOUNTER — Emergency Department
Admission: EM | Admit: 2022-05-29 | Discharge: 2022-05-29 | Disposition: A | Discharge status: Home / Self Care | Payer: Medicaid Other | Attending: Emergency Medicine | Admitting: Emergency Medicine

## 2022-05-29 DIAGNOSIS — R0789 Other chest pain: Secondary | ICD-10-CM | POA: Insufficient documentation

## 2022-05-29 DIAGNOSIS — I10 Essential (primary) hypertension: Secondary | ICD-10-CM | POA: Diagnosis not present

## 2022-05-29 DIAGNOSIS — R079 Chest pain, unspecified: Secondary | ICD-10-CM | POA: Diagnosis present

## 2022-05-29 LAB — BASIC METABOLIC PANEL
Anion gap: 11 (ref 5–15)
BUN: 8 mg/dL (ref 6–20)
CO2: 19 mmol/L — ABNORMAL LOW (ref 22–32)
Calcium: 8.6 mg/dL — ABNORMAL LOW (ref 8.9–10.3)
Chloride: 108 mmol/L (ref 98–111)
Creatinine, Ser: 0.71 mg/dL (ref 0.44–1.00)
GFR, Estimated: >60 mL/min (ref >60)
Glucose, Bld: 90 mg/dL (ref 70–99)
Potassium: 3.4 mmol/L — ABNORMAL LOW (ref 3.5–5.1)
Sodium: 138 mmol/L (ref 135–145)

## 2022-05-29 LAB — TROPONIN I (HIGH SENSITIVITY)
Troponin I (High Sensitivity): 3 ng/L (ref <18)
Troponin I (High Sensitivity): 4 ng/L (ref <18)

## 2022-05-29 LAB — HCG, QUANTITATIVE, PREGNANCY: hCG, Beta Chain, Quant, S: <1 m[IU]/mL (ref <5)

## 2022-05-29 LAB — CBC
HCT: 32.9 % — ABNORMAL LOW (ref 36.0–46.0)
Hemoglobin: 9.9 g/dL — ABNORMAL LOW (ref 12.0–15.0)
MCH: 26.5 pg (ref 26.0–34.0)
MCHC: 30.1 g/dL (ref 30.0–36.0)
MCV: 88 fL (ref 80.0–100.0)
Platelets: 311 10*3/uL (ref 150–400)
RBC: 3.74 MIL/uL — ABNORMAL LOW (ref 3.87–5.11)
RDW: 17.3 % — ABNORMAL HIGH (ref 11.5–15.5)
WBC: 4.6 10*3/uL (ref 4.0–10.5)
nRBC: 0 % (ref 0.0–0.2)

## 2022-05-29 LAB — POC URINE PREG, ED: Preg Test, Ur: NEGATIVE

## 2022-05-29 LAB — D-DIMER, QUANTITATIVE: D-Dimer, Quant: 0.48 ug/mL-FEU (ref 0.00–0.50)

## 2022-05-29 MED ORDER — POTASSIUM CHLORIDE CRYS ER 20 MEQ PO TBCR
20.0000 meq | EXTENDED_RELEASE_TABLET | Freq: Once | ORAL | Status: AC
  Administered 2022-05-29: 20 meq via ORAL
  Filled 2022-05-29: qty 1

## 2022-05-29 NOTE — ED Provider Notes (Signed)
[facility] Provider Note    Event Date/Time   First MD Initiated Contact with Patient 05/29/22 1620     (approximate)   History   Chief Complaint Chest Pain   HPI  Mariah Fox is a 41 y.o. female with past medical history of hypertension and polysubstance abuse who presents to the ED complaining of chest pain.  Patient reports that she has had constant sharp pain in the center of her chest for the past 5 days.  She states this pain is worse when she goes to take a deep breath, but she denies any recent fevers or cough.  She does report that she has noticed a tender nodule in the center of her chest, however this is separate from her other chest pain.  She has not felt short of breath and has not noticed any pain or swelling in her legs, denies any history of DVT/PE.  She denies any recent fevers and has not had any sick contacts.     Physical Exam   Triage Vital Signs: ED Triage Vitals  Enc Vitals Group     BP 05/29/22 1326 (!) 153/104     Pulse Rate 05/29/22 1326 93     Resp 05/29/22 1326 18     Temp 05/29/22 1327 98 F (36.7 C)     Temp Source 05/29/22 1327 Oral     SpO2 05/29/22 1326 99 %     Weight 05/29/22 1327 145 lb (65.8 kg)     Height 05/29/22 1327 5\' 5"  (1.651 m)     Head Circumference --      Peak Flow --      Pain Score 05/29/22 1326 8     Pain Loc --      Pain Edu? --      Excl. in GC? --     Most recent vital signs: Vitals:   05/29/22 1327 05/29/22 1728  BP:  (!) 150/90  Pulse:  80  Resp:  18  Temp: 98 F (36.7 C) 98 F (36.7 C)  SpO2:  99%    Constitutional: Alert and oriented. Eyes: Conjunctivae are normal. Head: Atraumatic. Nose: No congestion/rhinnorhea. Mouth/Throat: Mucous membranes are moist.  Cardiovascular: Normal rate, regular rhythm. Grossly normal heart sounds.  2+ radial pulses bilaterally. Respiratory: Normal respiratory effort.  No retractions. Lungs CTAB. Gastrointestinal: Soft and nontender. No  distention. Musculoskeletal: No lower extremity tenderness nor edema.  Neurologic:  Normal speech and language. No gross focal neurologic deficits are appreciated.    ED Results / Procedures / Treatments   Labs (all labs ordered are listed, but only abnormal results are displayed) Labs Reviewed  BASIC METABOLIC PANEL - Abnormal; Notable for the following components:      Result Value   Potassium 3.4 (*)    CO2 19 (*)    Calcium 8.6 (*)    All other components within normal limits  CBC - Abnormal; Notable for the following components:   RBC 3.74 (*)    Hemoglobin 9.9 (*)    HCT 32.9 (*)    RDW 17.3 (*)    All other components within normal limits  D-DIMER, QUANTITATIVE  HCG, QUANTITATIVE, PREGNANCY  POC URINE PREG, ED  TROPONIN I (HIGH SENSITIVITY)  TROPONIN I (HIGH SENSITIVITY)     EKG  ED ECG REPORT I, 05/31/22, the attending physician, personally viewed and interpreted this ECG.   Date: 05/29/2022  EKG Time: 13:30  Rate: 91  Rhythm: normal sinus  rhythm  Axis: Normal  Intervals:none  ST&T Change: None  RADIOLOGY Chest x-ray reviewed and interpreted by me with no infiltrate, edema, or effusion.  PROCEDURES:  Critical Care performed: No  Procedures   MEDICATIONS ORDERED IN ED: Medications  potassium chloride SA (KLOR-CON M) CR tablet 20 mEq (20 mEq Oral Given 05/29/22 1810)     IMPRESSION / MDM / ASSESSMENT AND PLAN / ED COURSE  I reviewed the triage vital signs and the nursing notes.                              41 y.o. female with past medical history of hypertension and polysubstance abuse who presents to the ED complaining of constant sharp pain in her chest for the past 5 days, worse with a deep breath.  Patient's presentation is most consistent with acute presentation with potential threat to life or bodily function.  Differential diagnosis includes, but is not limited to, ACS, PE, pneumonia, pneumothorax, dissection, musculoskeletal  pain, pleurisy, GERD.  Patient nontoxic-appearing and in no acute distress, vital signs are unremarkable.  EKG shows no evidence of arrhythmia or ischemia, low suspicion for ACS given atypical symptoms and troponin within normal limits.  Chest x-ray is also unremarkable, will further assess with D-dimer given pleuritic pain, however patient is low risk by Wells criteria.  Remainder of labs are reassuring with mild anemia, no significant leukocytosis, electrolyte abnormality, or AKI.  D-dimer within normal limits, doubt PE given patient is low risk by Wells criteria.  Repeat troponin also within normal limits and I doubt cardiac etiology of her symptoms.  On further examination, patient does have a small tender nodule between both breasts along her chest wall, but no associated signs of infection.  She was counseled to use warm compresses on this area, alternate Tylenol and ibuprofen.  She was counseled to follow-up with her PCP and return to the ED for new or worsening symptoms, patient agrees with plan.      FINAL CLINICAL IMPRESSION(S) / ED DIAGNOSES   Final diagnoses:  Atypical chest pain     Rx / DC Orders   ED Discharge Orders     None        Note:  This document was prepared using Dragon voice recognition software and may include unintentional dictation errors.   Chesley Noon, MD 05/29/22 [phone]

## 2022-05-29 NOTE — ED Triage Notes (Signed)
Patient arrives ambulatory by POV c/o chest pain and back pain onset of yesterday. Patient reports lump on her breast that she noticed last night.

## 2022-06-03 ENCOUNTER — Other Ambulatory Visit: Payer: Self-pay

## 2022-06-03 DIAGNOSIS — N6320 Unspecified lump in the left breast, unspecified quadrant: Secondary | ICD-10-CM

## 2022-06-23 ENCOUNTER — Ambulatory Visit: Payer: Self-pay

## 2022-06-23 ENCOUNTER — Inpatient Hospital Stay: Admission: RE | Admit: 2022-06-23 | Payer: Self-pay | Source: Ambulatory Visit

## 2022-06-23 ENCOUNTER — Other Ambulatory Visit: Payer: Self-pay

## 2022-07-07 ENCOUNTER — Other Ambulatory Visit: Payer: Self-pay

## 2022-07-07 ENCOUNTER — Ambulatory Visit: Payer: Self-pay

## 2022-07-21 ENCOUNTER — Ambulatory Visit
Admission: RE | Admit: 2022-07-21 | Discharge: 2022-07-21 | Disposition: A | Discharge status: Home / Self Care | Payer: Self-pay | Source: Ambulatory Visit | Attending: Obstetrics and Gynecology | Admitting: Obstetrics and Gynecology

## 2022-07-21 ENCOUNTER — Ambulatory Visit: Payer: Self-pay | Attending: Hematology and Oncology | Admitting: Hematology and Oncology

## 2022-07-21 ENCOUNTER — Other Ambulatory Visit: Payer: Self-pay | Admitting: Obstetrics and Gynecology

## 2022-07-21 VITALS — BP 142/100 | Wt 144.0 lb

## 2022-07-21 DIAGNOSIS — N6320 Unspecified lump in the left breast, unspecified quadrant: Secondary | ICD-10-CM

## 2022-07-21 DIAGNOSIS — N6310 Unspecified lump in the right breast, unspecified quadrant: Secondary | ICD-10-CM | POA: Insufficient documentation

## 2022-07-21 NOTE — Progress Notes (Signed)
[name] is a 42 y.o. female who presents to [facility] clinic today with complaint of left breast lump.    Pap Smear: Pap not smear completed today. Last Pap smear was 2023 at [place] [facility] and was abnormal - ASCUS/ HPV- . Per patient has no history of an abnormal Pap smear. Last Pap smear result is available in Epic.   Physical exam: Breasts Breasts symmetrical. No skin abnormalities bilateral breasts. No nipple retraction bilateral breasts. No nipple discharge bilateral breasts. No lymphadenopathy. No lumps palpated bilateral breasts.       Pelvic/Bimanual Pap is not indicated today    Smoking History: Patient has is a current smoker at 1 packs per day and was referred to quit line.    Patient Navigation: Patient education provided. Access to services provided for patient through [facility] program. No interpreter provided. No transportation provided   Colorectal Cancer Screening: Per patient has never had colonoscopy completed No complaints today.    Breast and Cervical Cancer Risk Assessment: Patient does not have family history of breast cancer, known genetic mutations, or radiation treatment to the chest before age 52. Patient does not have history of cervical dysplasia, immunocompromised, or DES exposure in-utero.  Risk Assessment   No risk assessment data     A: BCCCP exam without pap smear Complaint of left breast lump x 2 weeks that has now resolved. Benign exam.  P: Referred patient to the [facility] for a diagnostic mammogram. Appointment scheduled 07/21/22.  Dayton Scrape A, NP 07/21/2022 9:02 AM

## 2022-07-21 NOTE — Patient Instructions (Signed)
[employer] about breast self awareness and gave educational materials to take home. Patient did not need a Pap smear today due to last Pap smear was in 2023 per patient. She will be due in 2026. Let her know BCCCP will cover Pap smears every 5 years unless has a history of abnormal Pap smears. [facility]. Appointment scheduled for 07/21/22. Patient aware of appointment and will be there. Let patient know will follow up with her within the next couple weeks with results. Mariah Fox verbalized understanding.  Melodye Ped, NP 9:15 AM

## 2022-07-23 ENCOUNTER — Other Ambulatory Visit: Payer: Self-pay | Admitting: Obstetrics and Gynecology

## 2022-07-23 DIAGNOSIS — R928 Other abnormal and inconclusive findings on diagnostic imaging of breast: Secondary | ICD-10-CM

## 2022-07-30 ENCOUNTER — Other Ambulatory Visit: Payer: Self-pay

## 2022-07-30 DIAGNOSIS — N6310 Unspecified lump in the right breast, unspecified quadrant: Secondary | ICD-10-CM

## 2022-09-22 ENCOUNTER — Telehealth: Payer: Self-pay | Admitting: Family Medicine

## 2022-09-22 NOTE — Telephone Encounter (Signed)
Called patient to schedule a repeat PE and PAP. Phone number on file did not work. Called Patient's mother and got a new number that did not work either.

## 2022-09-22 NOTE — Telephone Encounter (Signed)
Letter mailed s/t no phone contact.

## 2022-11-29 ENCOUNTER — Emergency Department
Admission: EM | Admit: 2022-11-29 | Discharge: 2022-11-29 | Disposition: A | Discharge status: Home / Self Care | Payer: Self-pay | Attending: Emergency Medicine | Admitting: Emergency Medicine

## 2022-11-29 ENCOUNTER — Other Ambulatory Visit: Payer: Self-pay

## 2022-11-29 ENCOUNTER — Emergency Department: Payer: Self-pay

## 2022-11-29 DIAGNOSIS — R112 Nausea with vomiting, unspecified: Secondary | ICD-10-CM | POA: Insufficient documentation

## 2022-11-29 DIAGNOSIS — R7989 Other specified abnormal findings of blood chemistry: Secondary | ICD-10-CM | POA: Insufficient documentation

## 2022-11-29 DIAGNOSIS — R5383 Other fatigue: Secondary | ICD-10-CM | POA: Insufficient documentation

## 2022-11-29 DIAGNOSIS — I10 Essential (primary) hypertension: Secondary | ICD-10-CM | POA: Insufficient documentation

## 2022-11-29 LAB — CBC
HCT: 36.4 % (ref 36.0–46.0)
Hemoglobin: 11.8 g/dL — ABNORMAL LOW (ref 12.0–15.0)
MCH: 31.2 pg (ref 26.0–34.0)
MCHC: 32.4 g/dL (ref 30.0–36.0)
MCV: 96.3 fL (ref 80.0–100.0)
Platelets: 195 10*3/uL (ref 150–400)
RBC: 3.78 MIL/uL — ABNORMAL LOW (ref 3.87–5.11)
RDW: 14.4 % (ref 11.5–15.5)
WBC: 4 10*3/uL (ref 4.0–10.5)
nRBC: 0 % (ref 0.0–0.2)

## 2022-11-29 LAB — URINALYSIS, COMPLETE (UACMP) WITH MICROSCOPIC
Bilirubin Urine: NEGATIVE
Glucose, UA: NEGATIVE mg/dL
Hgb urine dipstick: NEGATIVE
Ketones, ur: NEGATIVE mg/dL
Nitrite: NEGATIVE
Protein, ur: 30 mg/dL — AB
Specific Gravity, Urine: 1.025 (ref 1.005–1.030)
pH: 5 (ref 5.0–8.0)

## 2022-11-29 LAB — COMPREHENSIVE METABOLIC PANEL
ALT: 52 U/L — ABNORMAL HIGH (ref 0–44)
AST: 142 U/L — ABNORMAL HIGH (ref 15–41)
Albumin: 4.1 g/dL (ref 3.5–5.0)
Alkaline Phosphatase: 85 U/L (ref 38–126)
Anion gap: 10 (ref 5–15)
BUN: 9 mg/dL (ref 6–20)
CO2: 21 mmol/L — ABNORMAL LOW (ref 22–32)
Calcium: 8.8 mg/dL — ABNORMAL LOW (ref 8.9–10.3)
Chloride: 103 mmol/L (ref 98–111)
Creatinine, Ser: 0.64 mg/dL (ref 0.44–1.00)
GFR, Estimated: >60 mL/min (ref >60)
Glucose, Bld: 131 mg/dL — ABNORMAL HIGH (ref 70–99)
Potassium: 3.4 mmol/L — ABNORMAL LOW (ref 3.5–5.1)
Sodium: 134 mmol/L — ABNORMAL LOW (ref 135–145)
Total Bilirubin: 0.8 mg/dL (ref 0.3–1.2)
Total Protein: 7.3 g/dL (ref 6.5–8.1)

## 2022-11-29 LAB — LIPASE, BLOOD: Lipase: 28 U/L (ref 11–51)

## 2022-11-29 LAB — POC URINE PREG, ED: Preg Test, Ur: NEGATIVE

## 2022-11-29 MED ORDER — SODIUM CHLORIDE 0.9 % IV BOLUS
1000.0000 mL | Freq: Once | INTRAVENOUS | Status: AC
  Administered 2022-11-29: 1000 mL via INTRAVENOUS

## 2022-11-29 MED ORDER — ONDANSETRON HCL 4 MG/2ML IJ SOLN
4.0000 mg | Freq: Once | INTRAMUSCULAR | Status: AC
  Administered 2022-11-29: 4 mg via INTRAVENOUS
  Filled 2022-11-29: qty 2

## 2022-11-29 MED ORDER — ONDANSETRON 4 MG PO TBDP: 4.0000 mg | ORAL_TABLET | Freq: Three times a day (TID) | ORAL | 0 refills | Status: AC | PRN

## 2022-11-29 NOTE — Discharge Instructions (Signed)
Please take your nausea medication as needed, as written.  Please drink plenty of fluids and obtain plenty of rest over the weekend.  Return to the emergency department for any abdominal pain, any nausea vomiting unable to keep down fluids or any other symptom personally concerning to yourself.

## 2022-11-29 NOTE — ED Provider Notes (Signed)
[facility] Provider Note    Event Date/Time   First MD Initiated Contact with Patient 11/29/22 [id number]     (approximate)  History   Chief Complaint: Vomiting  HPI  Mariah Fox is a 42 y.o. female with a past medical history of hypertension who presents to the emergency department for generalized weakness.  According to the patient she woke up this morning with several episodes of vomiting.  Denies any diarrhea denies any abdominal pain.  She states after vomiting she has been feeling more weak and dizzy so she came to the emergency department.  Denies any recent fever cough congestion.  No chest pain shortness of breath.  Patient denies really any other symptoms besides fatigue and nausea this morning.  Last menstrual period approximately 3 weeks ago.  Physical Exam   Triage Vital Signs: ED Triage Vitals  Enc Vitals Group     BP 11/29/22 0732 (!) 157/103     Pulse Rate 11/29/22 0732 90     Resp 11/29/22 0732 16     Temp 11/29/22 0732 98.6 F (37 C)     Temp Source 11/29/22 0732 Oral     SpO2 11/29/22 0732 100 %     Weight 11/29/22 0731 143 lb 15.4 oz (65.3 kg)     Height 11/29/22 0731 5\' 5"  (1.651 m)     Head Circumference --      Peak Flow --      Pain Score 11/29/22 0731 0     Pain Loc --      Pain Edu? --      Excl. in GC? --     Most recent vital signs: Vitals:   11/29/22 0732  BP: (!) 157/103  Pulse: 90  Resp: 16  Temp: 98.6 F (37 C)  SpO2: 100%    General: Awake, no distress.  CV:  Good peripheral perfusion.  Regular rate and rhythm  Resp:  Normal effort.  Equal breath sounds bilaterally.  Abd:  No distention.  Soft, nontender.  No rebound or guarding.  ED Results / Procedures / Treatments   EKG  EKG viewed and interpreted by myself shows a normal sinus rhythm 81 bpm with a narrow QRS, normal axis, normal intervals, no concerning ST changes.  RADIOLOGY  Normal right upper quadrant ultrasound   MEDICATIONS ORDERED IN  ED: Medications - No data to display   IMPRESSION / MDM / ASSESSMENT AND PLAN / ED COURSE  I reviewed the triage vital signs and the nursing notes.  Patient's presentation is most consistent with acute presentation with potential threat to life or bodily function.  Patient presents to the emergency department for nausea and several episodes of vomiting this morning and generalized fatigue.  Overall the patient appears well, reassuring physical exam, reassuring vital signs with mild hypertension.  We will check labs including CBC chemistry urinalysis and pregnancy test.  Will IV hydrate treat with Zofran and continue to closely monitor.  Patient agreeable to plan of care.  Patient's lab work has resulted showing a normal CBC, chemistry shows LFT elevation normal lipase, normal urinalysis and pregnancy test is negative.  Given the patient's elevated LFTs, we have obtained a right upper quadrant ultrasound to further evaluate.  Right upper quadrant ultrasound has resulted showing normal results.  I discussed with the patient the LFT findings.  Patient does admit to near daily alcohol use.  Discussed with the patient about discontinuing/limiting her alcohol intake.  Also discussed PCP follow-up.  Will prescribe nausea medication.  Patient agreeable to plan of care.  FINAL CLINICAL IMPRESSION(S) / ED DIAGNOSES   Nausea vomiting Fatigue   Note:  This document was prepared using Dragon voice recognition software and may include unintentional dictation errors.   Minna Antis, MD 11/29/22 1055

## 2022-11-29 NOTE — ED Triage Notes (Signed)
C/O vomiting this morning.  States was not feeling well a few days ago at work. Feeling shaky and weak.  Denies fever/chills.

## 2023-01-20 ENCOUNTER — Ambulatory Visit: Payer: Self-pay | Attending: Obstetrics and Gynecology

## 2023-02-01 ENCOUNTER — Emergency Department: Payer: Self-pay

## 2023-02-01 ENCOUNTER — Emergency Department
Admission: EM | Admit: 2023-02-01 | Discharge: 2023-02-01 | Disposition: A | Discharge status: Home / Self Care | Payer: Self-pay | Attending: Student in an Organized Health Care Education/Training Program | Admitting: Student in an Organized Health Care Education/Training Program

## 2023-02-01 ENCOUNTER — Other Ambulatory Visit: Payer: Self-pay

## 2023-02-01 DIAGNOSIS — R079 Chest pain, unspecified: Secondary | ICD-10-CM | POA: Insufficient documentation

## 2023-02-01 DIAGNOSIS — Z5321 Procedure and treatment not carried out due to patient leaving prior to being seen by health care provider: Secondary | ICD-10-CM | POA: Insufficient documentation

## 2023-02-01 DIAGNOSIS — R0602 Shortness of breath: Secondary | ICD-10-CM | POA: Insufficient documentation

## 2023-02-01 LAB — CBC
HCT: 40 % (ref 36.0–46.0)
Hemoglobin: 13.5 g/dL (ref 12.0–15.0)
MCH: 33.2 pg (ref 26.0–34.0)
MCHC: 33.8 g/dL (ref 30.0–36.0)
MCV: 98.3 fL (ref 80.0–100.0)
Platelets: 296 10*3/uL (ref 150–400)
RBC: 4.07 MIL/uL (ref 3.87–5.11)
RDW: 14.2 % (ref 11.5–15.5)
WBC: 4.1 10*3/uL (ref 4.0–10.5)
nRBC: 0 % (ref 0.0–0.2)

## 2023-02-01 LAB — BASIC METABOLIC PANEL
Anion gap: 12 (ref 5–15)
BUN: 6 mg/dL (ref 6–20)
CO2: 23 mmol/L (ref 22–32)
Calcium: 8.8 mg/dL — ABNORMAL LOW (ref 8.9–10.3)
Chloride: 105 mmol/L (ref 98–111)
Creatinine, Ser: 0.71 mg/dL (ref 0.44–1.00)
GFR, Estimated: >60 mL/min (ref >60)
Glucose, Bld: 98 mg/dL (ref 70–99)
Potassium: 4 mmol/L (ref 3.5–5.1)
Sodium: 140 mmol/L (ref 135–145)

## 2023-02-01 LAB — TROPONIN I (HIGH SENSITIVITY): Troponin I (High Sensitivity): 5 ng/L (ref <18)

## 2023-02-01 NOTE — ED Triage Notes (Signed)
Pt here with cp and SOB. Pt states pain is intermittent, pain starts in the center and radiates to her right side. Pt describes pain as squeezing in nature. Pt denies NVD.

## 2023-06-30 DIAGNOSIS — L309 Dermatitis, unspecified: Secondary | ICD-10-CM | POA: Diagnosis not present

## 2023-06-30 DIAGNOSIS — Z1389 Encounter for screening for other disorder: Secondary | ICD-10-CM | POA: Diagnosis not present

## 2023-06-30 DIAGNOSIS — Z23 Encounter for immunization: Secondary | ICD-10-CM | POA: Diagnosis not present

## 2023-07-16 DIAGNOSIS — Z1389 Encounter for screening for other disorder: Secondary | ICD-10-CM | POA: Diagnosis not present

## 2023-07-16 DIAGNOSIS — M62838 Other muscle spasm: Secondary | ICD-10-CM | POA: Diagnosis not present

## 2023-07-16 DIAGNOSIS — Z0131 Encounter for examination of blood pressure with abnormal findings: Secondary | ICD-10-CM | POA: Diagnosis not present

## 2023-08-13 DIAGNOSIS — Z0131 Encounter for examination of blood pressure with abnormal findings: Secondary | ICD-10-CM | POA: Diagnosis not present

## 2023-08-13 DIAGNOSIS — L0291 Cutaneous abscess, unspecified: Secondary | ICD-10-CM | POA: Diagnosis not present

## 2023-08-13 DIAGNOSIS — Z1389 Encounter for screening for other disorder: Secondary | ICD-10-CM | POA: Diagnosis not present

## 2023-08-27 ENCOUNTER — Emergency Department
Admission: EM | Admit: 2023-08-27 | Discharge: 2023-08-27 | Disposition: A | Discharge status: Home / Self Care | Attending: Emergency Medicine | Admitting: Emergency Medicine

## 2023-08-27 ENCOUNTER — Other Ambulatory Visit: Payer: Self-pay

## 2023-08-27 ENCOUNTER — Emergency Department

## 2023-08-27 DIAGNOSIS — I10 Essential (primary) hypertension: Secondary | ICD-10-CM | POA: Diagnosis not present

## 2023-08-27 DIAGNOSIS — S4991XA Unspecified injury of right shoulder and upper arm, initial encounter: Secondary | ICD-10-CM | POA: Diagnosis present

## 2023-08-27 DIAGNOSIS — S46911A Strain of unspecified muscle, fascia and tendon at shoulder and upper arm level, right arm, initial encounter: Secondary | ICD-10-CM

## 2023-08-27 DIAGNOSIS — S46811A Strain of other muscles, fascia and tendons at shoulder and upper arm level, right arm, initial encounter: Secondary | ICD-10-CM | POA: Diagnosis not present

## 2023-08-27 DIAGNOSIS — W010XXA Fall on same level from slipping, tripping and stumbling without subsequent striking against object, initial encounter: Secondary | ICD-10-CM | POA: Diagnosis not present

## 2023-08-27 MED ORDER — IBUPROFEN 600 MG PO TABS
600.0000 mg | ORAL_TABLET | Freq: Once | ORAL | Status: AC
  Administered 2023-08-27: 600 mg via ORAL
  Filled 2023-08-27: qty 1

## 2023-08-27 MED ORDER — MELOXICAM 15 MG PO TABS: 15.0000 mg | ORAL_TABLET | Freq: Every day | ORAL | 2 refills | Status: AC

## 2023-08-27 NOTE — ED Notes (Signed)
 See triage notes. Patient stated she was startled awake this morning causing her to jump up from the bed and trip over her feet. Patient c/o right shoulder pain and swelling.

## 2023-08-27 NOTE — ED Triage Notes (Signed)
 Patient states she tripped and fell over the weekend and now having right shoulder pain.

## 2023-08-27 NOTE — Discharge Instructions (Signed)
 Follow up with orthopedics if not improving in 1 week Take the medication as prescribed

## 2023-08-27 NOTE — ED Provider Notes (Signed)
 [facility] Provider Note    Event Date/Time   First MD Initiated Contact with Patient 08/27/23 1117     (approximate)   History   Shoulder Pain   HPI  Mariah Fox is a 43 y.o. female with history of hypertension presents emergency department with right shoulder injury.  Patient states she tripped and fell over the weekend and has now been having pain in the right shoulder.  Symptoms have been ongoing for 4 days.  Patient is right-handed.  No numbness or tingling.  No neck pain.      Physical Exam   Triage Vital Signs: ED Triage Vitals [08/27/23 1033]  Encounter Vitals Group     BP (!) 143/103     Systolic BP Percentile      Diastolic BP Percentile      Pulse Rate 100     Resp 18     Temp 98.7 F (37.1 C)     Temp Source Oral     SpO2 95 %     Weight 150 lb (68 kg)     Height 5\' 5"  (1.651 m)     Head Circumference      Peak Flow      Pain Score 10     Pain Loc      Pain Education      Exclude from Growth Chart     Most recent vital signs: Vitals:   08/27/23 1033  BP: (!) 143/103  Pulse: 100  Resp: 18  Temp: 98.7 F (37.1 C)  SpO2: 95%     General: Awake, no distress.   CV:  Good peripheral perfusion. regular rate and  rhythm Resp:  Normal effort.  Abd:  No distention.   Other:  Right shoulder tender at the ACJ, seems to be a little swollen, patient does have full range of motion but it causes discomfort, grips equal bilaterally, neurovascular intact   ED Results / Procedures / Treatments   Labs (all labs ordered are listed, but only abnormal results are displayed) Labs Reviewed - No data to display   EKG     RADIOLOGY X-ray of the right shoulder    PROCEDURES:   Procedures Chief Complaint  Patient presents with   Shoulder Pain      MEDICATIONS ORDERED IN ED: Medications  ibuprofen (ADVIL) tablet 600 mg (has no administration in time range)     IMPRESSION / MDM / ASSESSMENT AND PLAN / ED  COURSE  I reviewed the triage vital signs and the nursing notes.                              Differential diagnosis includes, but is not limited to, fracture, dislocation, ACJ separation, contusion, sprain  Patient's presentation is most consistent with acute complicated illness / injury requiring diagnostic workup.   X-ray of the right shoulder independently reviewed interpreted by me as being negative for any acute abnormality  Due to the physical presentation feel patient may have a slight ACJ separation or inflammation.  Will go ahead and place her in a sling, she is given ice pack, ibuprofen 600 mg p.o. while here in the ED.  Prescription for meloxicam at discharge.  Patient will follow-up with orthopedics if not improving in 1 week.  She states she understands all instructions.  She is in agreement treatment plan.  She is discharged stable condition.      FINAL  CLINICAL IMPRESSION(S) / ED DIAGNOSES   Final diagnoses:  Strain of right shoulder, initial encounter  Acromioclavicular [facility]) joint injury, right, initial encounter     Rx / DC Orders   ED Discharge Orders          Ordered    meloxicam (MOBIC) 15 MG tablet  Daily        08/27/23 1202             Note:  This document was prepared using Dragon voice recognition software and may include unintentional dictation errors.    Faythe Ghee, PA-C 08/27/23 1207    Minna Antis, MD 08/27/23 [phone]

## 2024-04-03 ENCOUNTER — Other Ambulatory Visit: Payer: Self-pay

## 2024-04-03 ENCOUNTER — Emergency Department

## 2024-04-03 ENCOUNTER — Emergency Department
Admission: EM | Admit: 2024-04-03 | Discharge: 2024-04-03 | Disposition: A | Discharge status: Home / Self Care | Attending: Emergency Medicine | Admitting: Emergency Medicine

## 2024-04-03 ENCOUNTER — Encounter: Payer: Self-pay | Admitting: Emergency Medicine

## 2024-04-03 DIAGNOSIS — R531 Weakness: Secondary | ICD-10-CM

## 2024-04-03 DIAGNOSIS — D649 Anemia, unspecified: Secondary | ICD-10-CM | POA: Insufficient documentation

## 2024-04-03 DIAGNOSIS — R42 Dizziness and giddiness: Secondary | ICD-10-CM | POA: Diagnosis present

## 2024-04-03 LAB — URINALYSIS, ROUTINE W REFLEX MICROSCOPIC
Bilirubin Urine: NEGATIVE
Glucose, UA: NEGATIVE mg/dL
Hgb urine dipstick: NEGATIVE
Ketones, ur: 20 mg/dL — AB
Leukocytes,Ua: NEGATIVE
Nitrite: NEGATIVE
Protein, ur: NEGATIVE mg/dL
Specific Gravity, Urine: 1.018 (ref 1.005–1.030)
pH: 5 (ref 5.0–8.0)

## 2024-04-03 LAB — COMPREHENSIVE METABOLIC PANEL WITH GFR
ALT: 47 U/L — ABNORMAL HIGH (ref 0–44)
AST: 129 U/L — ABNORMAL HIGH (ref 15–41)
Albumin: 3.8 g/dL (ref 3.5–5.0)
Alkaline Phosphatase: 83 U/L (ref 38–126)
Anion gap: 18 — ABNORMAL HIGH (ref 5–15)
BUN: 9 mg/dL (ref 6–20)
CO2: 19 mmol/L — ABNORMAL LOW (ref 22–32)
Calcium: 8.4 mg/dL — ABNORMAL LOW (ref 8.9–10.3)
Chloride: 102 mmol/L (ref 98–111)
Creatinine, Ser: 0.61 mg/dL (ref 0.44–1.00)
GFR, Estimated: >60 mL/min (ref >60)
Glucose, Bld: 73 mg/dL (ref 70–99)
Potassium: 3.5 mmol/L (ref 3.5–5.1)
Sodium: 139 mmol/L (ref 135–145)
Total Bilirubin: 0.9 mg/dL (ref 0.0–1.2)
Total Protein: 7.6 g/dL (ref 6.5–8.1)

## 2024-04-03 LAB — CBC
HCT: 30.3 % — ABNORMAL LOW (ref 36.0–46.0)
Hemoglobin: 9.5 g/dL — ABNORMAL LOW (ref 12.0–15.0)
MCH: 28.4 pg (ref 26.0–34.0)
MCHC: 31.4 g/dL (ref 30.0–36.0)
MCV: 90.7 fL (ref 80.0–100.0)
Platelets: 211 K/uL (ref 150–400)
RBC: 3.34 MIL/uL — ABNORMAL LOW (ref 3.87–5.11)
RDW: 18.6 % — ABNORMAL HIGH (ref 11.5–15.5)
WBC: 5.1 K/uL (ref 4.0–10.5)
nRBC: 0 % (ref 0.0–0.2)

## 2024-04-03 LAB — RESP PANEL BY RT-PCR (RSV, FLU A&B, COVID)  RVPGX2
Influenza A by PCR: NEGATIVE
Influenza B by PCR: NEGATIVE
Resp Syncytial Virus by PCR: NEGATIVE
SARS Coronavirus 2 by RT PCR: NEGATIVE

## 2024-04-03 LAB — POC URINE PREG, ED: Preg Test, Ur: NEGATIVE

## 2024-04-03 LAB — CBG MONITORING, ED: Glucose-Capillary: 91 mg/dL (ref 70–99)

## 2024-04-03 MED ORDER — ONDANSETRON HCL 4 MG PO TABS: 4.0000 mg | ORAL_TABLET | Freq: Four times a day (QID) | ORAL | 0 refills | Status: AC | PRN

## 2024-04-03 MED ORDER — ONDANSETRON HCL 4 MG/2ML IJ SOLN
4.0000 mg | Freq: Once | INTRAMUSCULAR | Status: AC
  Administered 2024-04-03: 4 mg via INTRAVENOUS
  Filled 2024-04-03: qty 2

## 2024-04-03 MED ORDER — SODIUM CHLORIDE 0.9 % IV BOLUS
1000.0000 mL | Freq: Once | INTRAVENOUS | Status: AC
  Administered 2024-04-03: 1000 mL via INTRAVENOUS

## 2024-04-03 MED ORDER — MECLIZINE HCL 25 MG PO TABS: 25.0000 mg | ORAL_TABLET | Freq: Three times a day (TID) | ORAL | 0 refills | Status: AC | PRN

## 2024-04-03 MED ORDER — MECLIZINE HCL 25 MG PO TABS
25.0000 mg | ORAL_TABLET | Freq: Once | ORAL | Status: AC
  Administered 2024-04-03: 25 mg via ORAL
  Filled 2024-04-03: qty 1

## 2024-04-03 NOTE — Discharge Instructions (Signed)
 You were seen in the ER today for your dizziness and weakness.  Your testing fortunately did not show an emergency cause for this.  Your hemoglobin level was low.  Please follow-up with your primary care doctor for further evaluation of your anemia and discuss with them your prior episodes of rectal bleeding.  If send a prescription for nausea medication and dizziness medication to your pharmacy that you can take as needed.  Return to the ER for any new or worsening symptoms.

## 2024-04-03 NOTE — ED Triage Notes (Signed)
 Pt to ED from home c/o weakness and dizziness since waking up this morning to go to the bathroom.  States fell back onto the bed but not to the floor, denies LOC, denies n/v/d or pain.

## 2024-04-03 NOTE — ED Provider Notes (Signed)
 [facility] Provider Note    Event Date/Time   First MD Initiated Contact with Patient 04/03/24 [phone]     (approximate)   History   Weakness and Dizziness   HPI  Mariah Fox is a 43 year old female presenting to the emergency department for evaluation of weakness..  Patient reports she noticed this when she woke up this morning she felt generally weak.  Felt at her baseline the past few days.  Denies any focal numbness, tingling, weakness.  Reports dizziness described as an intermittent spinning sensation worse when she is up and moving.  Denies history of vertigo.  No LOC.  No known sick contacts.  No fevers.  No chest pain or shortness of breath no recent cough, congestion.  Does report nausea without vomiting.  No abdominal pain.     Physical Exam   Triage Vital Signs: ED Triage Vitals  Encounter Vitals Group     BP 04/03/24 0623 125/82     Girls Systolic BP Percentile --      Girls Diastolic BP Percentile --      Boys Systolic BP Percentile --      Boys Diastolic BP Percentile --      Pulse Rate 04/03/24 0623 74     Resp 04/03/24 0623 16     Temp 04/03/24 0623 97.6 F (36.4 C)     Temp Source 04/03/24 0623 Oral     SpO2 04/03/24 0623 100 %     Weight 04/03/24 0622 145 lb (65.8 kg)     Height 04/03/24 0622 5' 5 (1.651 m)     Head Circumference --      Peak Flow --      Pain Score 04/03/24 0622 0     Pain Loc --      Pain Education --      Exclude from Growth Chart --     Most recent vital signs: Vitals:   04/03/24 0824 04/03/24 0825  BP:    Pulse: 80 77  Resp: 18 (!) 22  Temp:    SpO2: 100% 100%     General: Awake, interactive  CV:  Good peripheral perfusion Resp:  Unlabored respirations, lungs clear to auscultation Abd:  Nondistended.  Neuro:  Alert and oriented, normal extraocular movements, symmetric facial movement, sensation intact over bilateral upper and lower extremities with 5 out of 5 strength.  Normal  finger-to-nose testing.   ED Results / Procedures / Treatments   Labs (all labs ordered are listed, but only abnormal results are displayed) Labs Reviewed  COMPREHENSIVE METABOLIC PANEL WITH GFR - Abnormal; Notable for the following components:      Result Value   CO2 19 (*)    Calcium 8.4 (*)    AST 129 (*)    ALT 47 (*)    Anion gap 18 (*)    All other components within normal limits  CBC - Abnormal; Notable for the following components:   RBC 3.34 (*)    Hemoglobin 9.5 (*)    HCT 30.3 (*)    RDW 18.6 (*)    All other components within normal limits  URINALYSIS, ROUTINE W REFLEX MICROSCOPIC - Abnormal; Notable for the following components:   Color, Urine YELLOW (*)    APPearance HAZY (*)    Ketones, ur 20 (*)    All other components within normal limits  RESP PANEL BY RT-PCR (RSV, FLU A&B, COVID)  RVPGX2  POC URINE PREG, ED  CBG MONITORING,  ED     EKG EKG independently reviewed and interpreted by myself demonstrates:  EKG demonstrates normal sinus rhythm at a rate of 75, PR 168, QRS 82, QTc 491, no acute ST changes  RADIOLOGY Imaging independently reviewed and interpreted by myself demonstrates:  CXR without focal consolidation  Formal Radiology Read:  DG Chest Portable 1 View Result Date: 04/03/2024 EXAM: 1 VIEW(S) XRAY OF THE CHEST 04/03/2024 07:29:06 AM COMPARISON: PA and lateral chest 02/01/2023. CLINICAL HISTORY: weakness FINDINGS: LINES, TUBES AND DEVICES: Telemetry leads are present. LUNGS AND PLEURA: No focal pulmonary opacity. No pulmonary edema. No pleural effusion. No pneumothorax. HEART AND MEDIASTINUM: No acute abnormality of the cardiac and mediastinal silhouettes. BONES AND SOFT TISSUES: Artifacts from overlying clothing are present. No acute osseous abnormality. IMPRESSION: 1. No acute cardiopulmonary process. 2. Artifacts from overlying clothing and telemetry leads. Electronically signed by: Francis Quam MD 04/03/2024 07:34 AM EST RP Workstation:  [id number]    PROCEDURES:  Critical Care performed: No  Procedures   MEDICATIONS ORDERED IN ED: Medications  ondansetron  (ZOFRAN ) injection 4 mg (4 mg Intravenous Given 04/03/24 0756)  sodium chloride  0.9 % bolus 1,000 mL (1,000 mLs Intravenous New Bag/Given 04/03/24 0801)  meclizine (ANTIVERT) tablet 25 mg (25 mg Oral Given 04/03/24 0759)     IMPRESSION / MDM / ASSESSMENT AND PLAN / ED COURSE  I reviewed the triage vital signs and the nursing notes.  Differential diagnosis includes, but is not limited to, anemia, electrolyte abnormality, dehydration, vertigo, viral illness, infection including UTI, pneumonia  Patient's presentation is most consistent with acute presentation with potential threat to life or bodily function.  43 year old female presenting to the emergency department for evaluation of weakness.  Stable vitals on presentation.  With anemia with hemoglobin of 9.5, priors in our system from last year ranging from 9.9 to 13.  Patient denies vaginal bleeding.  Reports over the last year she has had an episodes of rectal bleeding, denies any recent episodes.  Brown stool on rectal exam, Hemoccult negative.  Has not seen a doctor for this.  CBC with normal white blood cell count.  CMP with mild anion gap acidosis but normal glucose, urine with trace ketones suspect may be related to dehydration.  Mild transaminitis similar to prior noted.  Did receive IV fluids as well as Zofran  and meclizine.   UPT negative.  Chest x-Hayley Horn without focal consolidation.  Patient reassessed and feels much improved on reevaluation.  Suspect weakness likely related to a combination of dehydration with possible component of vertigo.  No focal deficits on neurologic exam, very low suspicion for central process. Patient is comfortable with discharge home.  She does have a primary care doctor she can follow-up with for further evaluation of her symptoms including anemia.  Strict return precautions provided.   Discharged with prescription for Zofran  and meclizine.    FINAL CLINICAL IMPRESSION(S) / ED DIAGNOSES   Final diagnoses:  Dizziness  Weakness  Anemia, unspecified type     Rx / DC Orders   ED Discharge Orders          Ordered    meclizine (ANTIVERT) 25 MG tablet  3 times daily PRN        04/03/24 0905    ondansetron  (ZOFRAN ) 4 MG tablet  Every 6 hours PRN        04/03/24 9094             Note:  This document was prepared using Dragon voice recognition software and may  include unintentional dictation errors.   Levander Slate, MD 04/03/24 [phone]
